# Patient Record
Sex: Female | Born: 1940 | Race: White | Hispanic: No | Smoking: Current every day smoker
Health system: Southern US, Community
[De-identification: ages and names within clinical notes are randomized; demographics above are authoritative.]

## PROBLEM LIST (undated history)

## (undated) DIAGNOSIS — Z950 Presence of cardiac pacemaker: Secondary | ICD-10-CM

## (undated) DIAGNOSIS — F32A Depression, unspecified: Secondary | ICD-10-CM

## (undated) DIAGNOSIS — I639 Cerebral infarction, unspecified: Secondary | ICD-10-CM

## (undated) DIAGNOSIS — F329 Major depressive disorder, single episode, unspecified: Secondary | ICD-10-CM

## (undated) HISTORY — PX: PACEMAKER PLACEMENT: SHX43

---

## 2004-02-02 ENCOUNTER — Other Ambulatory Visit: Payer: Self-pay

## 2004-11-13 ENCOUNTER — Ambulatory Visit: Payer: Self-pay | Admitting: Internal Medicine

## 2005-01-14 ENCOUNTER — Ambulatory Visit: Payer: Self-pay | Admitting: Internal Medicine

## 2005-04-23 ENCOUNTER — Ambulatory Visit: Payer: Self-pay | Admitting: Gastroenterology

## 2005-05-21 ENCOUNTER — Ambulatory Visit: Payer: Self-pay | Admitting: Internal Medicine

## 2005-11-20 ENCOUNTER — Ambulatory Visit: Payer: Self-pay | Admitting: Internal Medicine

## 2005-12-01 ENCOUNTER — Ambulatory Visit: Payer: Self-pay | Admitting: Internal Medicine

## 2005-12-09 ENCOUNTER — Ambulatory Visit: Payer: Self-pay | Admitting: Internal Medicine

## 2006-01-27 ENCOUNTER — Ambulatory Visit: Payer: Self-pay | Admitting: Internal Medicine

## 2006-06-01 ENCOUNTER — Ambulatory Visit: Payer: Self-pay | Admitting: Internal Medicine

## 2006-12-03 ENCOUNTER — Ambulatory Visit: Payer: Self-pay | Admitting: Internal Medicine

## 2007-02-01 ENCOUNTER — Ambulatory Visit: Payer: Self-pay | Admitting: Internal Medicine

## 2007-06-08 ENCOUNTER — Ambulatory Visit: Payer: Self-pay | Admitting: Internal Medicine

## 2008-02-02 ENCOUNTER — Ambulatory Visit: Payer: Self-pay | Admitting: Internal Medicine

## 2009-02-07 ENCOUNTER — Ambulatory Visit: Payer: Self-pay | Admitting: Internal Medicine

## 2010-06-25 ENCOUNTER — Ambulatory Visit: Payer: Self-pay | Admitting: Internal Medicine

## 2011-02-04 ENCOUNTER — Ambulatory Visit: Payer: Self-pay | Admitting: Gastroenterology

## 2011-02-08 LAB — PATHOLOGY REPORT

## 2011-07-22 ENCOUNTER — Ambulatory Visit: Payer: Self-pay | Admitting: Internal Medicine

## 2012-08-02 ENCOUNTER — Ambulatory Visit: Payer: Self-pay | Admitting: Internal Medicine

## 2013-01-13 LAB — CBC
HGB: 12.1 g/dL (ref 12.0–16.0)
MCV: 86 fL (ref 80–100)
RDW: 14.7 % — ABNORMAL HIGH (ref 11.5–14.5)

## 2013-01-13 LAB — COMPREHENSIVE METABOLIC PANEL
Albumin: 3.2 g/dL — ABNORMAL LOW (ref 3.4–5.0)
Alkaline Phosphatase: 60 U/L (ref 50–136)
BUN: 10 mg/dL (ref 7–18)
Co2: 21 mmol/L (ref 21–32)
Creatinine: 0.81 mg/dL (ref 0.60–1.30)
EGFR (Non-African Amer.): >60
Glucose: 95 mg/dL (ref 65–99)
Osmolality: 284 (ref 275–301)
Potassium: 3.2 mmol/L — ABNORMAL LOW (ref 3.5–5.1)
SGOT(AST): 17 U/L (ref 15–37)
SGPT (ALT): 15 U/L (ref 12–78)
Sodium: 143 mmol/L (ref 136–145)
Total Protein: 6.2 g/dL — ABNORMAL LOW (ref 6.4–8.2)

## 2013-01-13 LAB — DRUG SCREEN, URINE
Barbiturates, Ur Screen: NEGATIVE (ref ?–200)
Cannabinoid 50 Ng, Ur ~~LOC~~: NEGATIVE (ref ?–50)
Cocaine Metabolite,Ur ~~LOC~~: NEGATIVE (ref ?–300)
MDMA (Ecstasy)Ur Screen: NEGATIVE (ref ?–500)
Methadone, Ur Screen: NEGATIVE (ref ?–300)
Opiate, Ur Screen: NEGATIVE (ref ?–300)
Phencyclidine (PCP) Ur S: NEGATIVE (ref ?–25)
Tricyclic, Ur Screen: NEGATIVE (ref ?–1000)

## 2013-01-13 LAB — URINALYSIS, COMPLETE
Bacteria: NONE SEEN
Bilirubin,UR: NEGATIVE
Ketone: NEGATIVE
Leukocyte Esterase: NEGATIVE
Nitrite: NEGATIVE
Ph: 6 (ref 4.5–8.0)
Protein: NEGATIVE
Squamous Epithelial: 1
WBC UR: 1 /HPF (ref 0–5)

## 2013-01-13 LAB — CK TOTAL AND CKMB (NOT AT ARMC): CK, Total: 34 U/L (ref 21–215)

## 2013-01-13 LAB — ETHANOL: Ethanol %: <0.003 % (ref 0.000–0.080)

## 2013-01-14 LAB — COMPREHENSIVE METABOLIC PANEL
Albumin: 2.6 g/dL — ABNORMAL LOW (ref 3.4–5.0)
Alkaline Phosphatase: 57 U/L (ref 50–136)
Anion Gap: 4 — ABNORMAL LOW (ref 7–16)
BUN: 8 mg/dL (ref 7–18)
Calcium, Total: 7.8 mg/dL — ABNORMAL LOW (ref 8.5–10.1)
Chloride: 118 mmol/L — ABNORMAL HIGH (ref 98–107)
Osmolality: 285 (ref 275–301)
Potassium: 3.9 mmol/L (ref 3.5–5.1)
SGOT(AST): 11 U/L — ABNORMAL LOW (ref 15–37)
Sodium: 144 mmol/L (ref 136–145)

## 2013-01-15 LAB — BASIC METABOLIC PANEL
Anion Gap: 6 — ABNORMAL LOW (ref 7–16)
Calcium, Total: 8.8 mg/dL (ref 8.5–10.1)
Co2: 23 mmol/L (ref 21–32)
Creatinine: 0.55 mg/dL — ABNORMAL LOW (ref 0.60–1.30)
EGFR (African American): >60
EGFR (Non-African Amer.): >60
Glucose: 87 mg/dL (ref 65–99)
Osmolality: 286 (ref 275–301)
Potassium: 4.4 mmol/L (ref 3.5–5.1)
Sodium: 145 mmol/L (ref 136–145)

## 2013-01-15 LAB — TSH: Thyroid Stimulating Horm: 2.38 u[IU]/mL

## 2013-01-17 ENCOUNTER — Inpatient Hospital Stay: Payer: Self-pay | Admitting: Internal Medicine

## 2013-01-17 DIAGNOSIS — Z950 Presence of cardiac pacemaker: Secondary | ICD-10-CM

## 2013-01-17 LAB — CBC WITH DIFFERENTIAL/PLATELET
Basophil %: 0.7 %
Eosinophil #: 0.3 10*3/uL (ref 0.0–0.7)
Eosinophil %: 3.7 %
HCT: 33.9 % — ABNORMAL LOW (ref 35.0–47.0)
HGB: 11.6 g/dL — ABNORMAL LOW (ref 12.0–16.0)
Lymphocyte %: 18.7 %
MCH: 29.5 pg (ref 26.0–34.0)
MCV: 86 fL (ref 80–100)
Monocyte #: 0.5 x10 3/mm (ref 0.2–0.9)
Monocyte %: 6.3 %
Neutrophil #: 5.9 10*3/uL (ref 1.4–6.5)
RDW: 14.6 % — ABNORMAL HIGH (ref 11.5–14.5)
WBC: 8.3 10*3/uL (ref 3.6–11.0)

## 2013-01-17 LAB — BASIC METABOLIC PANEL
Anion Gap: 6 — ABNORMAL LOW (ref 7–16)
Chloride: 114 mmol/L — ABNORMAL HIGH (ref 98–107)
Co2: 24 mmol/L (ref 21–32)
Creatinine: 0.58 mg/dL — ABNORMAL LOW (ref 0.60–1.30)
EGFR (African American): >60
Osmolality: 284 (ref 275–301)
Potassium: 4.1 mmol/L (ref 3.5–5.1)

## 2013-01-17 LAB — APTT: Activated PTT: 28.6 secs (ref 23.6–35.9)

## 2013-01-18 LAB — CBC WITH DIFFERENTIAL/PLATELET
HCT: 35.2 % (ref 35.0–47.0)
HGB: 12.2 g/dL (ref 12.0–16.0)
Lymphocyte %: 17.8 %
MCH: 30.1 pg (ref 26.0–34.0)
MCHC: 34.7 g/dL (ref 32.0–36.0)
Monocyte #: 0.5 x10 3/mm (ref 0.2–0.9)
Neutrophil %: 71.9 %
Platelet: 192 10*3/uL (ref 150–440)
RBC: 4.05 10*6/uL (ref 3.80–5.20)
RDW: 14 % (ref 11.5–14.5)

## 2013-01-18 LAB — BASIC METABOLIC PANEL
Chloride: 113 mmol/L — ABNORMAL HIGH (ref 98–107)
Creatinine: 0.66 mg/dL (ref 0.60–1.30)
EGFR (African American): >60
Osmolality: 280 (ref 275–301)
Sodium: 141 mmol/L (ref 136–145)

## 2013-01-19 LAB — CULTURE, BLOOD (SINGLE)

## 2014-04-02 ENCOUNTER — Emergency Department: Payer: Self-pay | Admitting: Internal Medicine

## 2014-04-02 LAB — TROPONIN I: Troponin-I: <0.02 ng/mL

## 2014-04-02 LAB — CBC
HCT: 41.6 % (ref 35.0–47.0)
HGB: 13.4 g/dL (ref 12.0–16.0)
MCH: 28.6 pg (ref 26.0–34.0)
MCHC: 32.2 g/dL (ref 32.0–36.0)
MCV: 89 fL (ref 80–100)
PLATELETS: 226 10*3/uL (ref 150–440)
RBC: 4.68 10*6/uL (ref 3.80–5.20)
RDW: 14.2 % (ref 11.5–14.5)
WBC: 12.6 10*3/uL — AB (ref 3.6–11.0)

## 2014-04-02 LAB — COMPREHENSIVE METABOLIC PANEL
ALK PHOS: 77 U/L
ALT: 24 U/L
ANION GAP: 7 (ref 7–16)
Albumin: 3.7 g/dL (ref 3.4–5.0)
BILIRUBIN TOTAL: 0.4 mg/dL (ref 0.2–1.0)
BUN: 7 mg/dL (ref 7–18)
CALCIUM: 9.2 mg/dL (ref 8.5–10.1)
CREATININE: 0.85 mg/dL (ref 0.60–1.30)
Chloride: 106 mmol/L (ref 98–107)
Co2: 25 mmol/L (ref 21–32)
EGFR (African American): >60
EGFR (Non-African Amer.): >60
Glucose: 103 mg/dL — ABNORMAL HIGH (ref 65–99)
Osmolality: 274 (ref 275–301)
POTASSIUM: 3.8 mmol/L (ref 3.5–5.1)
SGOT(AST): 24 U/L (ref 15–37)
SODIUM: 138 mmol/L (ref 136–145)
TOTAL PROTEIN: 7.6 g/dL (ref 6.4–8.2)

## 2014-04-02 LAB — LITHIUM LEVEL: Lithium: 0.21 mmol/L — ABNORMAL LOW

## 2014-12-29 NOTE — Op Note (Signed)
PATIENT NAME:  Donna SimmondsCLARY, Alaisha H MR#:  [id number] DATE OF BIRTH:  [birth year]  DATE OF PROCEDURE:  01/17/2013  PRIMARY CARE PHYSICIAN: Dr. Marcello FennelHande  PREPROCEDURE DIAGNOSIS: Sinus bradycardia, sick sinus syndrome.   PROCEDURE: Dual chamber pacemaker implantation.   POSTPROCEDURE DIAGNOSIS: Atrial pacing with ventricular sensing.   INDICATION: The patient is a 74 year old female with a several week history of new onset generalized weakness and fatigue. [facility] Room following a syncopal episode and was noted to be bradycardic with sinus bradycardia with heart rates in the low 40s. The patient was admitted to telemetry where she ruled out for myocardial infarction. She has remained in sinus bradycardia with rates as low as 27 beats per minute. The procedure, risks, benefits, and alternatives of permanent pacemaker implantation were explained to the patient and informed written consent was obtained.   DESCRIPTION OF PROCEDURE: She was brought to the Operating Room in a fasting state. The left pectoral region was prepped and draped in the usual sterile manner. Anesthesia was obtained with 1% Xylocaine locally. A 6 cm incision was performed over the left pectoral region. The pacemaker pocket was generated by electrocautery and blunt dissection. Access was obtained through a left subclavian vein by fine needle aspiration. The right ventricular and right atrial leads were positioned into the right ventricular apex and right atrial appendage under fluoroscopic guidance. After proper thresholds were obtained, the leads were sutured in place. The leads were connected to a dual chamber rate responsive pacemaker generator (Medtronic Adapta [id number]). The pacemaker pocket was irrigated with gentamicin solution. The new pacemaker generator was positioned into the pocket. The pocket was closed with 2-0 and 4-0 Vicryl, respectively. Steri-Strips and pressure dressing were applied.     ____________________________ Marcina MillardAlexander Bassel Gaskill, MD ap:aw D: 01/17/2013 14:55:55 ET T: 01/18/2013 08:27:08 ET JOB#: [id number]  cc: Marcina MillardAlexander Edlin Ford, MD, <Dictator> Marcina MillardALEXANDER Gerri Acre MD ELECTRONICALLY SIGNED 01/25/2013 8:59

## 2014-12-29 NOTE — Consult Note (Signed)
PATIENT NAME:  Donna SimmondsCLARY, Rhylei H MR#:  [id number] DATE OF BIRTH:  1941-03-31  DATE OF CONSULTATION:  01/15/2013  REFERRING PHYSICIAN:  Bethann PunchesMark Miller, MD  CONSULTING PHYSICIAN:  Lamar BlinksBruce J. Akiva Josey, MD  REASON FOR CONSULTATION:  Syncope with bradycardia and hypertension.   CHIEF COMPLAINT: "I passed out".   HISTORY OF PRESENT ILLNESS:  This is a 74 year old female with known borderline hypertension and hyperlipidemia not requiring medication management. The patient has had a new onset of weakness and fatigue over the last several weeks to a month and then had an episode of syncope while in her driveway. She was somewhat somnolent for quite some time and was seen in the Emergency Room for this issue but had no evidence of congestive heart failure or true angina. Troponin, CK-MB were within normal limits with no evidence of myocardial infarction. The patient had an EKG showing sinus bradycardia and now has telemetry showing significant sinus bradycardia, which appears to be symptomatic in nature.   REVIEW OF SYSTEMS:  Her remainder of review of systems negative for vision change, ringing in the ears, hearing loss, cough, congestion, heartburn, nausea, vomiting, diarrhea, bloody stools, stomach pain, extremity pain, leg weakness, cramping of the buttocks, known blood clots, headaches, nosebleeds, congestion, trouble swallowing, frequent urination, urination at night, muscle weakness, numbness, anxiety, depression, skin lesions, skin rashes.   PAST MEDICAL HISTORY: 1.  Borderline hypertension. 2.  Hyperlipidemia.   FAMILY HISTORY:  No family members with early onset of cardiovascular disease.   SOCIAL HISTORY:  She smokes 1 pack per day and denies alcohol use.   ALLERGIES:  As listed.   MEDICATIONS:  As listed.   PHYSICAL EXAMINATION:  VITAL SIGNS:  Blood pressure is 110/62 bilaterally, heart rate is 48 upright, and reclining and regular.  GENERAL:  She is a well appearing female in no acute  distress.  HEENT:  No icterus, thyromegaly, ulcers, hemorrhage, or xanthelasma.  CARDIOVASCULAR:  Regular rate and rhythm with normal S1 and S2 without murmur, gallop, or rub. PMI is normal size and placement. Carotid upstroke normal without bruit. Jugular venous pressure is normal.  LUNGS:  A few basilar crackles with normal respirations.  ABDOMEN:  Soft, nontender, without hepatosplenomegaly or masses. Abdominal aorta is normal size without bruit.  EXTREMITIES:  Shows 2+ bilateral pulses in dorsal, pedal, radial, and femoral arteries without lower extremity edema, cyanosis, clubbing, or ulcers.  NEUROLOGIC:  She is oriented to time, place, and person with normal mood and affect.  ASSESSMENT:  This is a 74 year old female with borderline hypertension, hyperlipidemia, bipolar disorder with acute onset of syncope and symptomatic bradycardia with no current evidence of congestive heart failure or anginal symptoms.   RECOMMENDATIONS:  1.  Continue telemetry following for extent of heart rate control and further assessment and treatment.  2.  Echocardiogram for left ventricular systolic dysfunction, valvular heart disease causing syncope and/or other issues.  3.  Consideration of the possibility of carotid Doppler although currently no bruits heard.  4.  Proceed to dual-chamber pacemaker placement for symptomatic bradycardia. The patient understands the risks and benefits of dual-chamber pacemaker placement including the possibility of death, stroke, heart attack, infection, bleeding, blood clot, hemopericardium and pneumothorax. The patient is at low risk for conscious sedation.   ____________________________ Lamar BlinksBruce J. Kallon Caylor, MD bjk:jm D: 01/15/2013 11:14:54 ET T: 01/15/2013 16:54:32 ET JOB#: [id number]  cc: Lamar BlinksBruce J. Billiejean Schimek, MD, <Dictator> Lamar BlinksBRUCE J Airelle Everding MD ELECTRONICALLY SIGNED 01/17/2013 13:47

## 2014-12-29 NOTE — Consult Note (Signed)
PATIENT NAME:  Donna Dodson, Donna Dodson MR#:  [id number] DATE OF BIRTH:  02-22-1941  PSYCHIATRY CONSULTATION REPORT  DATE OF EVALUATION:  01/15/2013  CONSULTING PHYSICIAN:  Audery Amel, MD  IDENTIFYING INFORMATION AND REASON FOR CONSULT: A 74 year old woman with a history of bipolar disorder, currently in the hospital because of altered mental state, which is improving. Consultation for evaluation of history of bipolar disorder and depression.   HISTORY OF PRESENT ILLNESS: Information obtained from the patient and the chart. The patient was brought into the hospital after being found unconscious in her car, with an elevated temperature. She is now recovering from that. As far as her psychiatric condition she tells me that she has recently been feeling more down, depressed and bad. She attributes this primarily to the recent death of her husband. Her husband died 2 months ago and she is obviously continuing to grieve. Her mood stays down much of the time. Intermittent crying spells. She is sleeping well. Appetite has not been very good. She has had no interest in any activities. Has had no energy and is not leaving the house very much. She denies any psychotic symptoms. Denies any suicidal ideation currently, although she says the thought crossed her mind a couple of times recently. She says she had been compliant with her medicine, but on closer examination admits that she may have skipped it for a few days, which is why her lithium level was so low when she came in. She has been continuing to follow up with outpatient treatment with Dr. Alycia Rossetti, who has been her long-time psychiatrist.   PAST PSYCHIATRIC HISTORY: Long history of mental health problems. She describes psychiatric admissions a couple of decades ago for severe depression. She has had ECT in the past and has had suicide attempts in the past. She says there have also been episodes of mania in the past, but she has not had manic symptoms in many years.  She has been maintained on lithium and Zoloft and p.r.n. Ativan by Dr. Alycia Rossetti. Has not had a hospitalization in years.   SUBSTANCE USE HISTORY: Denies any current active substance abuse.   SOCIAL HISTORY: Elderly woman whose husband died a couple of months ago. She is not working. She does have adult children, but they do not live close by. Her social life is very limited. She is not involved in a church, does not get out very much, and admits that she has avoided social activities since her husband died.   MEDICAL HISTORY: Overall seems to be in pretty good medical health, all things considered, for her age.   CURRENT MEDICATIONS: Lithium carbonate 150 mg 3 times a day, Zoloft 50 mg per day, oxybutynin extended-release 10 mg per day.   ALLERGIES: SULFA DRUGS.   REVIEW OF SYSTEMS: Depressed mood, low energy, lack of interest in normal activities. No psychotic symptoms. Feels sleepy a lot of the time.   MENTAL STATUS EXAMINATION: The patient was cooperative and pleasant during the interview. Made good eye contact. Psychomotor activity sluggish. Speech was slow, but easy to understand. Affect blunted, but not tearful. Mood stated as being bad. Thoughts were generally lucid, with no evidence of loosening of associations or delusions. Denies auditory or visual hallucinations. Denies any active suicidal intent or plan, and can name reasons for not wanting to kill herself. Has reasonable optimism about improvement. Intelligence normal. Judgment and insight maintained. Alert and oriented x 4.   LABS: Most notable for her lithium level being less than 0.2,  which is the equivalent of  detectable. Her kidney function seems to be good.   EKG: Sinus bradycardia; otherwise pretty unremarkable.   ASSESSMENT: A 74 year old woman with a history of bipolar disorder, currently with depression symptoms also combined with grief. A little hard to distinguish major depression from grief in this situation. She does have  good insight into what she needs to do and the importance of trying to get more active. She does not appear to be an acute danger to herself. It looks like she was medicine-noncompliant recently, possibly out of the same anergia that is part of her depression.   TREATMENT PLAN: Supportive and educational therapy was done with the patient. Reviewed her medicines. She has been put back on the lithium since being in the hospital and she is completely agreeable to that, and the Zoloft. We will a recheck lithium level tomorrow.  I reinstated the order for the p.r.n. Ativan if she needs it, 1 mg twice a day p.r.n. The patient is agreeing to go to her outpatient appointment with Dr. Alycia Rossettiyan this upcoming week. She is not acutely dangerous to herself, does not need psychiatric hospitalization. I will come by and check on her and check on a lithium level tomorrow. I would anticipate that we can discharge her, probably on her current medicines, and she he is hooked up with good outpatient treatment and is not psychotic or acutely dangerous.   DIAGNOSES, PRINCIPAL AND PRIMARY:   AXIS I: Bipolar disorder, depressed.   SECONDARY DIAGNOSES: AXIS I: No further.  AXIS II: No diagnosis.  AXIS III: No diagnosis.  AXIS IV: Severe, from recent death of spouse.  AXIS V: Functioning at time of evaluation: 40.     ____________________________ Audery AmelJohn T. Clapacs, MD jtc:dm D: 01/15/2013 19:24:00 ET T: 01/16/2013 12:44:06 ET JOB#: [id number]  cc: Audery AmelJohn T. Clapacs, MD, <Dictator> Audery AmelJOHN T CLAPACS MD ELECTRONICALLY SIGNED 01/16/2013 21:52

## 2014-12-29 NOTE — Discharge Summary (Signed)
PATIENT NAME:  Donna Dodson, Donna Dodson MR#:  [id number] DATE OF BIRTH:  December 18, 1940  DATE OF ADMISSION:  01/17/2013 DATE OF DISCHARGE:    DISCHARGE DIAGNOSES: 1.  Symptomatic bradycardia with weakness and syncope.  2.  Status post pacemaker.  3.  Bipolar depression.  4.  Chronic tobacco use.   CHIEF COMPLAINT:  Weakness, fever, found unresponsive in the car.   HISTORY OF PRESENT ILLNESS:  Donna Dodson is a 74 year old female who was found by EMS unresponsive in the car where she was unable to get out with a fever of 102.  PAST MEDICAL HISTORY: Significant for bipolar syndrome followed by Dr. Alycia Rossettiyan and also has a history of type 2 diabetes, hyperlipidemia, chronic tobacco use, hyperparathyroidism and tremors.   PAST SURGICAL HISTORY:  Significant for total abdominal hysterectomy, appendectomy,  colonic resection and tonsillectomy.   PHYSICAL EXAMINATION: VITAL SIGNS:  She is afebrile, pulse was 80, respirations 20, blood pressure 106/60, pulse ox 96% on room air.  GENERAL: She was alert and oriented. No rash or petechiae.  HEENT:  NCAT. NECK:  Supple.  LUNGS:  Clear.  HEART:  S1, S2.  ABDOMEN: Soft, nontender.  EXTREMITIES:  No edema.  NEUROLOGIC:  Nonfocal.   HOSPITAL COURSE:  The patient was [facility] and received IV hydration; however, she was noted to be bradycardic with heart rate going as low as 38. She was seen in consultation by Dr. Toni Amendlapacs, as well as Dr. Gwen PoundsKowalski and underwent a pacemaker placement by Dr. Darrold JunkerParaschos on 01/17/2013. Symptomatically, she gradually improved. She was also seen by physical therapy and she was started on Zoloft for depression that got worse in the last few weeks because of her husband's demise. The patient made gradual progress.Her lithium level was low at 0.3 and kidney function was normal.She refused to to a rehab facility and preferred to go home with Home Health.She was discharged in stable condition.    The patient was discharged on the  following medications: 1.  Lithium 150 mg twice a day. 2.  Lorazepam 1 mg every 6 hours as needed. 3.  Ditropan XL 10 mg once a day. 4.  Sertraline 50 mg once a day. 5.  Acetaminophen codeine 1 tablet every 4 hours as needed for pain. 6  Keflex 500 mg po tid x 7 days  She has been advised to follow up with Dr. Gwen PoundsKowalski, cardiologist, and also me, Dr. Marcello FennelHande, in 1 to 2 weeks' time.   Total time spent in discharge and co ordination of care: 40 minutes   ____________________________ Donna ReichmannVishwanath Berl Bonfanti, MD vh:ce D: 01/19/2013 12:35:33 ET T: 01/19/2013 12:53:22 ET JOB#: [id number]  cc: Donna ReichmannVishwanath Klay Sobotka, MD, <Dictator> Donna ReichmannVISHWANATH Donna Cabello MD ELECTRONICALLY SIGNED 01/26/2013 18:02

## 2014-12-29 NOTE — Consult Note (Signed)
Psychiatry: Lithium level came back this morning as 0.3. Although this is low I would not change the dosage at this point because she is only been back on the medicine and after probably being noncompliant. He should take a few more days to equilabrate. Continue current treatment for  Electronic Signatures: Swan Zayed, Jackquline DenmarkJohn T (MD)  (Signed on 11-May-14 22:15)  Authored  Last Updated: 11-May-14 22:15 by Audery Amellapacs, Cassey Hurrell T (MD)

## 2014-12-29 NOTE — Consult Note (Signed)
Brief Consult Note: Diagnosis: bipolar disorder, depressed.   Patient was seen by consultant.   Consult note dictated.   Orders entered.   Comments: Psychiatry: PAtient seen. Patient with history of bipolar disorder. REcent loss of her husband. Having depressed symptoms but not acutely suicidal or dangerous to self. Alert and oriented and understands illness.Followed by Dr Alycia Rossettiyan and has appt coming up this week. Supportive therapy done. No change to Li or zoloft. Add prn ativan she has had before. REcheck Li level in the am.  Electronic Signatures: Clapacs, Jackquline DenmarkJohn T (MD)  (Signed 10-May-14 14:21)  Authored: Brief Consult Note   Last Updated: 10-May-14 14:21 by Audery Amellapacs, John T (MD)

## 2014-12-29 NOTE — H&P (Signed)
PATIENT NAME:  Donna Dodson, POZNANSKI MR#:  [id number] DATE OF BIRTH:  Oct 15, 1940  DATE OF ADMISSION:  01/13/2013  HISTORY OF PRESENT ILLNESS:  The patient is a 74 year old white lady followed by Dr. Marcello Fennel who was found unresponsive in her car.  She was attended to by EMS who recorded her temperature at 102 on the scene.  She was brought to the Emergency Room where she was noted to be warm to the touch and confused.  She was also noted to be hypotensive.  Laboratory evaluation in the ER was unremarkable except for a borderline low potassium.  Chest x-ray was unremarkable.  The patient was given vancomycin prophylactically by the ER physician.  The patient when I saw her was awake, alert and oriented x 3, although she is still relatively hypotensive.   PAST MEDICAL HISTORY:  Was notable for:  Bipolar syndrome for which she had been followed by Dr. Alycia Rossetti prior to his retirement, type 2 diabetes, hyperlipidemia, chronic tobacco use, a history of hyperparathyroidism, and a history of senile tremor.   PAST SURGICAL HISTORY:  Included a previous total abdominal hysterectomy in 1975 for endometriosis, an appendectomy, a colonic resection in 2005 for an adenoma, tonsillectomy.   ALLERGIES:  The patient was noted to be allergic to SULFA.   ADMISSION MEDICATIONS:  Included lithium carbonate 150 mg 3 times daily, lorazepam 1 mg every 4 hours as needed, enteric-coated Naprosyn 500 mg twice daily as needed, Zoloft 100 mg 1/2 tablet daily, oxybutynin 24 hour extended release 10 mg daily and MiraLAX powder 17 grams daily.   FAMILY HISTORY:  Noncontributory.   SOCIAL HISTORY:  Was notable for the fact that she continues to smoke 1/2 pack to a pack a day.  She apparently is a social drinker.   REVIEW OF SYSTEMS:  A reliable review of systems could not be obtained from the patient despite the fact she was alert and oriented because she did not even remember getting into her car much less passing out there.    PHYSICAL  EXAMINATION: VITAL SIGNS:  98.6, pulse is 80, respirations 20, blood pressure 106/60 and pulse ox is 96% on room air.  GENERAL:  This is an elderly lady who is alert and oriented x 3.  She is not flushed or warm to the touch.  There are no rashes or petechiae.  There is no lymphadenopathy.  HEENT:  Examination of the head, ears, eyes, nose and throat is unremarkable except for arterial narrowing on funduscopic exam.  NECK:  Is supple.  The thyroid is not palpable.  There is no JVD.  There are no carotid bruits.  LUNGS:  The lung fields are clear to auscultation.  BREASTS:  Not examined.  HEART:  Reveals a regular rhythm without murmur or gallop.  S1, S2 were normal.  ABDOMEN:  Soft and nontender.  Liver and spleen are not enlarged.  Bowel sounds were normal.  PELVIC AND RECTAL:  Deferred.  EXTREMITIES:  Showed no edema or deformity.  NEUROLOGIC:  Was physiological.   PLAN:  The patient is being admitted to observation where she will continue to receive IV hydration.  She was started back on her regular medications.  She had been on propranolol for senile tremor which apparently had recently been discontinued.  Supposedly she had been placed on Sinemet, but it is unclear whether or not she had been taking it.  It was not renewed.   HOSPITAL COURSE:  The patient will be re-evaluated in the morning.  If normotensive and still alert and oriented x 3 she can probably be discharged.     ____________________________ Letta PateJohn B. Danne HarborWalker III, MD jbw:ea D: 01/13/2013 20:28:12 ET T: 01/13/2013 20:56:43 ET JOB#: [id number]  cc: Jonny RuizJohn B. Danne HarborWalker III, MD, <Dictator> Elmo PuttJOHN B WALKER III MD ELECTRONICALLY SIGNED 01/14/2013 7:22

## 2015-07-26 ENCOUNTER — Emergency Department: Payer: Medicare Other

## 2015-07-26 ENCOUNTER — Emergency Department
Admission: EM | Admit: 2015-07-26 | Discharge: 2015-07-26 | Disposition: A | Discharge status: Home / Self Care | Payer: Medicare Other | Attending: Emergency Medicine | Admitting: Emergency Medicine

## 2015-07-26 ENCOUNTER — Encounter: Payer: Self-pay | Admitting: *Deleted

## 2015-07-26 DIAGNOSIS — R112 Nausea with vomiting, unspecified: Secondary | ICD-10-CM | POA: Diagnosis not present

## 2015-07-26 DIAGNOSIS — F17200 Nicotine dependence, unspecified, uncomplicated: Secondary | ICD-10-CM | POA: Diagnosis not present

## 2015-07-26 DIAGNOSIS — R197 Diarrhea, unspecified: Secondary | ICD-10-CM | POA: Insufficient documentation

## 2015-07-26 DIAGNOSIS — M79602 Pain in left arm: Secondary | ICD-10-CM | POA: Diagnosis not present

## 2015-07-26 HISTORY — DX: Major depressive disorder, single episode, unspecified: F32.9

## 2015-07-26 HISTORY — DX: Presence of cardiac pacemaker: Z95.0

## 2015-07-26 HISTORY — DX: Depression, unspecified: F32.A

## 2015-07-26 LAB — HEPATIC FUNCTION PANEL
ALT: 12 U/L — AB (ref 14–54)
AST: 22 U/L (ref 15–41)
Albumin: 4.2 g/dL (ref 3.5–5.0)
Alkaline Phosphatase: 75 U/L (ref 38–126)
Bilirubin, Direct: <0.1 mg/dL — ABNORMAL LOW (ref 0.1–0.5)
TOTAL PROTEIN: 7.7 g/dL (ref 6.5–8.1)
Total Bilirubin: 0.5 mg/dL (ref 0.3–1.2)

## 2015-07-26 LAB — BASIC METABOLIC PANEL
ANION GAP: 9 (ref 5–15)
BUN: 10 mg/dL (ref 6–20)
CALCIUM: 9.8 mg/dL (ref 8.9–10.3)
CO2: 23 mmol/L (ref 22–32)
Chloride: 107 mmol/L (ref 101–111)
Creatinine, Ser: 0.71 mg/dL (ref 0.44–1.00)
GLUCOSE: 110 mg/dL — AB (ref 65–99)
Potassium: 3.8 mmol/L (ref 3.5–5.1)
SODIUM: 139 mmol/L (ref 135–145)

## 2015-07-26 LAB — TROPONIN I

## 2015-07-26 LAB — CBC
HCT: 43.2 % (ref 35.0–47.0)
HEMOGLOBIN: 14.2 g/dL (ref 12.0–16.0)
MCH: 28.5 pg (ref 26.0–34.0)
MCHC: 33 g/dL (ref 32.0–36.0)
MCV: 86.3 fL (ref 80.0–100.0)
Platelets: 281 10*3/uL (ref 150–440)
RBC: 5 MIL/uL (ref 3.80–5.20)
RDW: 14.9 % — AB (ref 11.5–14.5)
WBC: 10.7 10*3/uL (ref 3.6–11.0)

## 2015-07-26 LAB — LIPASE, BLOOD: LIPASE: 26 U/L (ref 11–51)

## 2015-07-26 MED ORDER — ONDANSETRON 4 MG PO TBDP: 4.0000 mg | ORAL_TABLET | Freq: Three times a day (TID) | ORAL | Status: DC | PRN

## 2015-07-26 MED ORDER — ONDANSETRON HCL 4 MG/2ML IJ SOLN
4.0000 mg | Freq: Once | INTRAMUSCULAR | Status: AC
  Administered 2015-07-26: 4 mg via INTRAVENOUS
  Filled 2015-07-26: qty 2

## 2015-07-26 MED ORDER — ACETAMINOPHEN 500 MG PO TABS
1000.0000 mg | ORAL_TABLET | Freq: Once | ORAL | Status: AC
  Administered 2015-07-26: 1000 mg via ORAL
  Filled 2015-07-26: qty 2

## 2015-07-26 NOTE — ED Provider Notes (Signed)
[facility] Emergency Department Provider Note  ____________________________________________  Time seen: Approximately 5:39 PM  I have reviewed the triage vital signs and the nursing notes.   HISTORY  Chief Complaint Arm Pain    HPI Donna Dodson is a 74 y.o. female with a history of pacemaker placement for symptomatic bradycardia and 2014 presenting with left arm pain, nausea vomiting and diarrhea. Patient states that yesterday she developed a pain in her entire left arm that felt "like a toothache." She also had several episodes of nonbloody vomiting and 3 episodes of nonbloody loose stool. She did not have any chest pain, shortness of breath, palpitations, abdominal pain, fever, or urinary symptoms. Today, her arm pain persists, but she has not had any further episodes of nausea vomiting or diarrhea. She has a decreased appetite.   Past Medical History  Diagnosis Date  . Pacemaker   . Depressed     There are no active problems to display for this patient.   History reviewed. No pertinent past surgical history.  Current Outpatient Rx  Name  Route  Sig  Dispense  Refill  . ondansetron (ZOFRAN ODT) 4 MG disintegrating tablet   Oral   Take 1 tablet (4 mg total) by mouth every 8 (eight) hours as needed for nausea or vomiting.   20 tablet   0     Allergies Sulfa antibiotics  No family history on file.  Social History Social History  Substance Use Topics  . Smoking status: Current Every Day Smoker  . Smokeless tobacco: None  . Alcohol Use: No    Review of Systems Constitutional: No fever/chills. No lightheadedness or syncope. Eyes: No visual changes. ENT: No sore throat. Cardiovascular: Denies chest pain, palpitations. Respiratory: Denies shortness of breath.  No cough. Gastrointestinal: No abdominal pain.  Positive nausea, positive vomiting.  Positive diarrhea.  No constipation. Genitourinary: Negative for dysuria. Musculoskeletal:  Negative for back pain. Skin: Negative for rash. Neurological: Negative for headaches, focal weakness or numbness. No tingling. No changes in gait. No changes in vision or speech. No changes in mental status.  10-point ROS otherwise negative.  ____________________________________________   PHYSICAL EXAM:  VITAL SIGNS: ED Triage Vitals  Enc Vitals Group     BP 07/26/15 1714 127/79 mmHg     Pulse Rate 07/26/15 1714 74     Resp 07/26/15 1714 20     Temp 07/26/15 1714 98.7 F (37.1 C)     Temp Source 07/26/15 1714 Oral     SpO2 07/26/15 1714 96 %     Weight 07/26/15 1714 172 lb (78.019 kg)     Height 07/26/15 1714 5\' 5"  (1.651 m)     Head Cir --      Peak Flow --      Pain Score 07/26/15 1715 5     Pain Loc --      Pain Edu? --      Excl. in GC? --     Constitutional: Alert and oriented. Well appearing and in no acute distress. Answers questions appropriately. Eyes: Conjunctivae are normal.  EOMI. PERRLA. Head: Atraumatic. Nose: No congestion/rhinnorhea. Mouth/Throat: Mucous membranes are moist.  Neck: No stridor.  Supple.  No meningismus. No JVD. Cardiovascular: Normal rate, regular rhythm. No murmurs, rubs or gallops.  Respiratory: Normal respiratory effort.  No retractions. Lungs CTAB.  No wheezes, rales or ronchi. Gastrointestinal: Abdomen is soft, nontender, nondistended. She has no guarding or rebound. No perineal signs. Musculoskeletal: No LE edema. No calf  pain or palpable cords. No Homans sign. Full range of motion of the left shoulder, elbow, and wrist without pain. Normal left radial pulse. No evidence of swelling in the left upper externa joints. Neurologic: Alert and oriented 3. Speech is clear.  Face and smile symmetric. Tongue is midline.  No pronator drift. 5 out of 5 grip, biceps, triceps, hip flexors, plantar flexion and dorsiflexion. Normal sensation to light touch in the bilateral upper and lower extremities, and face. Normal gait without ataxia. Skin:  Skin  is warm, dry and intact. No rash noted. Psychiatric: Mood and affect are normal. Speech and behavior are normal.  Normal judgement.  ____________________________________________   LABS (all labs ordered are listed, but only abnormal results are displayed)  Labs Reviewed  BASIC METABOLIC PANEL - Abnormal; Notable for the following:    Glucose, Bld 110 (*)    All other components within normal limits  CBC - Abnormal; Notable for the following:    RDW 14.9 (*)    All other components within normal limits  HEPATIC FUNCTION PANEL - Abnormal; Notable for the following:    ALT 12 (*)    Bilirubin, Direct <0.1 (*)    All other components within normal limits  TROPONIN I  LIPASE, BLOOD  URINALYSIS COMPLETEWITH MICROSCOPIC ([facility] ONLY)   ____________________________________________  EKG  ED ECG REPORT I, Rockne Menghini, the attending physician, personally viewed and interpreted this ECG.   Date: 07/26/2015  EKG Time: 1709  Rate: 69  Rhythm: normal sinus rhythm  Axis: Normal  Intervals:none  ST&T Change: Nonspecific T-wave inversions in V1 and V2. No ST elevations.  Patient previously paced in EKGs from 01/2013 with similar T-wave inversions in V1 and V2. ____________________________________________  RADIOLOGY  Dg Chest 2 View  07/26/2015  CLINICAL DATA:  Left arm pain and nausea and vomiting EXAM: CHEST - 2 VIEW COMPARISON:  01/17/2013 common 06/08/2007 FINDINGS: Cardiac shadow is stable. A pacing device is again seen. The lungs are well aerated bilaterally. No focal infiltrate or sizable effusion is seen. A small nodule is noted between the anterior aspects of the third and fourth ribs on the right. It measures approximately 6 mm in dimension. IMPRESSION: No acute abnormality is noted. A nodular density is on the right as described. When compared with the prior CT examination from 2008 this appears to have been present at that time. Nonemergent noncontrast CT of the chest  may be helpful to confirm that this nodule corresponds to that seen on the prior exam. Electronically Signed   By: Alcide Clever M.D.   On: 07/26/2015 18:23    ____________________________________________   PROCEDURES  Procedure(s) performed: None  Critical Care performed: No ____________________________________________   INITIAL IMPRESSION / ASSESSMENT AND PLAN / ED COURSE  Pertinent labs & imaging results that were available during my care of the patient were reviewed by me and considered in my medical decision making (see chart for details).  74 y.o. female with a history of symptom at a bradycardia status post pacemaker presenting with left arm pain. She had several episodes of nausea vomiting and diarrhea which have now resolved. The patient's vital signs are reassuring and her exam does not show any signs of acute neurologic changes, and she has a reassuring cardiopulmonary exam. Given her age and history of pacemaker, I will evaluate her for an arrhythmia, consider acute MI, ACS. I do not see any evidence of neurologic deficits of stroke is unlikely. I will also evaluate for any electrolyte abnormalities.  It is unlikely that her arm pain is related to a traumatic injury as she has had no trauma.  Patient's evaluation in the emergency department did not show any evidence of acute MI, ACS, or ischemia. She has no evidence of CVA today. The patient is feeling better and will be discharged with close PMD follow-up. She understands return precautions as well as follow-up instructions.  ____________________________________________  FINAL CLINICAL IMPRESSION(S) / ED DIAGNOSES  Final diagnoses:  Left arm pain  Nausea vomiting and diarrhea      NEW MEDICATIONS STARTED DURING THIS VISIT:  Discharge Medication List as of 07/26/2015  7:01 PM    START taking these medications   Details  ondansetron (ZOFRAN ODT) 4 MG disintegrating tablet Take 1 tablet (4 mg total) by mouth every 8  (eight) hours as needed for nausea or vomiting., Starting 07/26/2015, Until Discontinued, Print         Rockne Menghini, MD 07/26/15 2233

## 2015-07-26 NOTE — ED Notes (Signed)
Yesterday develped left arm pain, n/v/d

## 2015-07-26 NOTE — Discharge Instructions (Signed)
Please drink plenty of fluid to stay well hydrated. Please take Zofran as needed for nausea. Please take a BRAT diet for the next 24-48 hours, then advance her diet as tolerated.  For your arm pain, you may take Tylenol or Motrin.  Please return to the emergency department if he developed chest pain, palpitations, shortness of breath, fainting, numbness tingling or weakness, or any other symptoms concerning to you.

## 2016-03-19 ENCOUNTER — Emergency Department: Payer: Medicare Other

## 2016-03-19 ENCOUNTER — Inpatient Hospital Stay
Admission: EM | Admit: 2016-03-19 | Discharge: 2016-03-23 | DRG: 558 | Disposition: A | Discharge status: Skilled Nursing Facility | Payer: Medicare Other | Attending: Internal Medicine | Admitting: Internal Medicine

## 2016-03-19 ENCOUNTER — Encounter: Payer: Self-pay | Admitting: Emergency Medicine

## 2016-03-19 DIAGNOSIS — E860 Dehydration: Secondary | ICD-10-CM | POA: Diagnosis present

## 2016-03-19 DIAGNOSIS — Z950 Presence of cardiac pacemaker: Secondary | ICD-10-CM

## 2016-03-19 DIAGNOSIS — Z79899 Other long term (current) drug therapy: Secondary | ICD-10-CM

## 2016-03-19 DIAGNOSIS — Y92002 Bathroom of unspecified non-institutional (private) residence single-family (private) house as the place of occurrence of the external cause: Secondary | ICD-10-CM

## 2016-03-19 DIAGNOSIS — R4182 Altered mental status, unspecified: Secondary | ICD-10-CM | POA: Diagnosis present

## 2016-03-19 DIAGNOSIS — R531 Weakness: Secondary | ICD-10-CM

## 2016-03-19 DIAGNOSIS — M6282 Rhabdomyolysis: Principal | ICD-10-CM | POA: Diagnosis present

## 2016-03-19 DIAGNOSIS — F439 Reaction to severe stress, unspecified: Secondary | ICD-10-CM | POA: Diagnosis present

## 2016-03-19 DIAGNOSIS — W19XXXA Unspecified fall, initial encounter: Secondary | ICD-10-CM | POA: Diagnosis present

## 2016-03-19 DIAGNOSIS — R55 Syncope and collapse: Secondary | ICD-10-CM | POA: Diagnosis not present

## 2016-03-19 DIAGNOSIS — Z23 Encounter for immunization: Secondary | ICD-10-CM

## 2016-03-19 DIAGNOSIS — Z882 Allergy status to sulfonamides status: Secondary | ICD-10-CM

## 2016-03-19 DIAGNOSIS — D72829 Elevated white blood cell count, unspecified: Secondary | ICD-10-CM | POA: Diagnosis present

## 2016-03-19 DIAGNOSIS — E876 Hypokalemia: Secondary | ICD-10-CM | POA: Diagnosis present

## 2016-03-19 DIAGNOSIS — F17210 Nicotine dependence, cigarettes, uncomplicated: Secondary | ICD-10-CM | POA: Diagnosis present

## 2016-03-19 DIAGNOSIS — F329 Major depressive disorder, single episode, unspecified: Secondary | ICD-10-CM | POA: Diagnosis not present

## 2016-03-19 DIAGNOSIS — I639 Cerebral infarction, unspecified: Secondary | ICD-10-CM

## 2016-03-19 LAB — CBC WITH DIFFERENTIAL/PLATELET
Basophils Absolute: 0.1 10*3/uL (ref 0–0.1)
Basophils Relative: 1 %
Eosinophils Absolute: 0 10*3/uL (ref 0–0.7)
Eosinophils Relative: 0 %
HEMATOCRIT: 38.4 % (ref 35.0–47.0)
HEMOGLOBIN: 12.9 g/dL (ref 12.0–16.0)
LYMPHS ABS: 0.8 10*3/uL — AB (ref 1.0–3.6)
LYMPHS PCT: 5 %
MCH: 29 pg (ref 26.0–34.0)
MCHC: 33.7 g/dL (ref 32.0–36.0)
MCV: 85.9 fL (ref 80.0–100.0)
Monocytes Absolute: 0.8 10*3/uL (ref 0.2–0.9)
Monocytes Relative: 5 %
NEUTROS ABS: 13.1 10*3/uL — AB (ref 1.4–6.5)
NEUTROS PCT: 89 %
Platelets: 181 10*3/uL (ref 150–440)
RBC: 4.47 MIL/uL (ref 3.80–5.20)
RDW: 14.6 % — ABNORMAL HIGH (ref 11.5–14.5)
WBC: 14.8 10*3/uL — AB (ref 3.6–11.0)

## 2016-03-19 LAB — COMPREHENSIVE METABOLIC PANEL
ALK PHOS: 71 U/L (ref 38–126)
ALT: 39 U/L (ref 14–54)
ANION GAP: 7 (ref 5–15)
AST: 118 U/L — ABNORMAL HIGH (ref 15–41)
Albumin: 3.2 g/dL — ABNORMAL LOW (ref 3.5–5.0)
BUN: 26 mg/dL — ABNORMAL HIGH (ref 6–20)
CALCIUM: 8.3 mg/dL — AB (ref 8.9–10.3)
CO2: 23 mmol/L (ref 22–32)
Chloride: 108 mmol/L (ref 101–111)
Creatinine, Ser: 0.66 mg/dL (ref 0.44–1.00)
GLUCOSE: 101 mg/dL — AB (ref 65–99)
Potassium: 3.4 mmol/L — ABNORMAL LOW (ref 3.5–5.1)
Sodium: 138 mmol/L (ref 135–145)
TOTAL PROTEIN: 6.5 g/dL (ref 6.5–8.1)
Total Bilirubin: 1.2 mg/dL (ref 0.3–1.2)

## 2016-03-19 LAB — MAGNESIUM: Magnesium: 2 mg/dL (ref 1.7–2.4)

## 2016-03-19 LAB — CK: Total CK: 9369 U/L — ABNORMAL HIGH (ref 38–234)

## 2016-03-19 LAB — TROPONIN I: Troponin I: <0.03 ng/mL (ref <0.03)

## 2016-03-19 MED ORDER — POTASSIUM CHLORIDE CRYS ER 20 MEQ PO TBCR
40.0000 meq | EXTENDED_RELEASE_TABLET | Freq: Once | ORAL | Status: AC
  Administered 2016-03-19: 22:00:00 40 meq via ORAL
  Filled 2016-03-19: qty 2

## 2016-03-19 MED ORDER — SODIUM CHLORIDE 0.9 % IV BOLUS (SEPSIS): 500.0000 mL | Freq: Once | INTRAVENOUS | Status: DC

## 2016-03-19 MED ORDER — DOCUSATE SODIUM 100 MG PO CAPS
100.0000 mg | ORAL_CAPSULE | Freq: Two times a day (BID) | ORAL | Status: DC
  Administered 2016-03-19 – 2016-03-22 (×5): 100 mg via ORAL
  Filled 2016-03-19 (×7): qty 1

## 2016-03-19 MED ORDER — SODIUM CHLORIDE 0.9 % IV SOLN
Freq: Once | INTRAVENOUS | Status: AC
  Administered 2016-03-19: 17:00:00 via INTRAVENOUS

## 2016-03-19 MED ORDER — ONDANSETRON HCL 4 MG/2ML IJ SOLN: 4.0000 mg | Freq: Four times a day (QID) | INTRAMUSCULAR | Status: DC | PRN

## 2016-03-19 MED ORDER — ALBUTEROL SULFATE (2.5 MG/3ML) 0.083% IN NEBU
2.5000 mg | INHALATION_SOLUTION | RESPIRATORY_TRACT | Status: DC | PRN
Start: 2016-03-19 — End: 2016-03-23

## 2016-03-19 MED ORDER — OXYBUTYNIN CHLORIDE ER 5 MG PO TB24
10.0000 mg | ORAL_TABLET | Freq: Every day | ORAL | Status: DC
Start: 2016-03-19 — End: 2016-03-23
  Administered 2016-03-19 – 2016-03-23 (×5): 10 mg via ORAL
  Filled 2016-03-19 (×5): qty 2

## 2016-03-19 MED ORDER — ENOXAPARIN SODIUM 40 MG/0.4ML ~~LOC~~ SOLN
40.0000 mg | SUBCUTANEOUS | Status: DC
  Administered 2016-03-19 – 2016-03-22 (×4): 40 mg via SUBCUTANEOUS
  Filled 2016-03-19 (×4): qty 0.4

## 2016-03-19 MED ORDER — ONDANSETRON HCL 4 MG PO TABS: 4.0000 mg | ORAL_TABLET | Freq: Four times a day (QID) | ORAL | Status: DC | PRN

## 2016-03-19 MED ORDER — ACETAMINOPHEN 325 MG PO TABS: 650.0000 mg | ORAL_TABLET | Freq: Four times a day (QID) | ORAL | Status: DC | PRN

## 2016-03-19 MED ORDER — ACETAMINOPHEN 650 MG RE SUPP: 650.0000 mg | Freq: Four times a day (QID) | RECTAL | Status: DC | PRN

## 2016-03-19 MED ORDER — SENNOSIDES-DOCUSATE SODIUM 8.6-50 MG PO TABS
1.0000 | ORAL_TABLET | Freq: Every evening | ORAL | Status: DC | PRN
  Administered 2016-03-22: 1 via ORAL
  Filled 2016-03-19: qty 1

## 2016-03-19 MED ORDER — SODIUM CHLORIDE 0.9 % IV SOLN
1000.0000 mL | Freq: Once | INTRAVENOUS | Status: AC
  Administered 2016-03-19: 1000 mL via INTRAVENOUS

## 2016-03-19 MED ORDER — HYDROCODONE-ACETAMINOPHEN 5-325 MG PO TABS: 1.0000 | ORAL_TABLET | ORAL | Status: DC | PRN

## 2016-03-19 MED ORDER — SODIUM CHLORIDE 0.9 % IV SOLN
INTRAVENOUS | Status: DC
  Administered 2016-03-19 – 2016-03-23 (×8): via INTRAVENOUS

## 2016-03-19 NOTE — ED Notes (Signed)
Patient at CT

## 2016-03-19 NOTE — ED Notes (Signed)
Could not transfer patient due to closeness of shift change.  Explained this to patient and she was brought back to room ED09 and made comfortable.  Will try again around 1930.

## 2016-03-19 NOTE — ED Notes (Signed)
XR at bedside

## 2016-03-19 NOTE — ED Notes (Signed)
Per EMS patient was found on the floor in the bathroom and with altered mental status.  EMS thinks she has been in the bathroom on the floor for two days.  She was found by a neighbor that apparently heard her cries.  She was found in feces and urine on the floor.

## 2016-03-19 NOTE — ED Provider Notes (Signed)
[facility] Emergency Department Provider Note        Time seen: ----------------------------------------- 2:23 PM on 03/19/2016 -----------------------------------------  L5 caveat: Review of systems and history is limited by altered mental status  I have reviewed the triage vital signs and the nursing notes.   HISTORY  Chief Complaint Fall and Altered Mental Status    HPI Donna Dodson is a 75 y.o. female who presents the ER being found on the floor of her home in the bathroom. It is suspected that she is been there for 2 days. EMS states she has been altered and confused. EMS had to force their way into the home. Patient denies complaints at this time.   Past Medical History  Diagnosis Date  . Pacemaker   . Depressed     There are no active problems to display for this patient.   No past surgical history on file.  Allergies Sulfa antibiotics  Social History Social History  Substance Use Topics  . Smoking status: Current Every Day Smoker  . Smokeless tobacco: Not on file  . Alcohol Use: No    Review of Systems Unknown at this time  ____________________________________________   PHYSICAL EXAM:  VITAL SIGNS: ED Triage Vitals  Enc Vitals Group     BP --      Pulse --      Resp --      Temp --      Temp src --      SpO2 --      Weight --      Height --      Head Cir --      Peak Flow --      Pain Score --      Pain Loc --      Pain Edu? --      Excl. in GC? --     Constitutional: Alert But disoriented, mild distress Eyes: Conjunctivae are normal. PERRL. Normal extraocular movements. ENT   Head: Normocephalic and atraumatic.   Nose: No congestion/rhinnorhea.   Mouth/Throat: Mucous membranes are dry   Neck: No stridor. Cardiovascular: Normal rate, regular rhythm. No murmurs, rubs, or gallops. Respiratory: Normal respiratory effort without tachypnea nor retractions. Breath sounds are clear and equal  bilaterally. No wheezes/rales/rhonchi. Gastrointestinal: Soft and nontender. Normal bowel sounds Musculoskeletal: Limited but unremarkable range of motion of the lower extremities. Some lower extremity edema is noted Neurologic:  Normal speech and language. Generalized weakness, nothing focal. Mild confusion. Skin:  Skin is warm, dry and intact. No rash noted. Psychiatric: Mood and affect are normal. Speech and behavior are normal.  ____________________________________________  ED COURSE:  Pertinent labs & imaging results that were available during my care of the patient were reviewed by me and considered in my medical decision making (see chart for details). Patient presents the ER after having been found on the floor of her home 2 days. She will likely be in rhabdomyolysis. We have started IV fluids and will check basic labs. ____________________________________________    LABS (pertinent positives/negatives)  Labs Reviewed  CBC WITH DIFFERENTIAL/PLATELET - Abnormal; Notable for the following:    WBC 14.8 (*)    RDW 14.6 (*)    Neutro Abs 13.1 (*)    Lymphs Abs 0.8 (*)    All other components within normal limits  COMPREHENSIVE METABOLIC PANEL - Abnormal; Notable for the following:    Potassium 3.4 (*)    Glucose, Bld 101 (*)    BUN 26 (*)  Calcium 8.3 (*)    Albumin 3.2 (*)    AST 118 (*)    All other components within normal limits  URINALYSIS COMPLETEWITH MICROSCOPIC ([facility] ONLY) - Abnormal; Notable for the following:    Color, Urine YELLOW (*)    APPearance CLEAR (*)    Ketones, ur 2+ (*)    Bacteria, UA RARE (*)    Squamous Epithelial / LPF 0-5 (*)    All other components within normal limits  CK - Abnormal; Notable for the following:    Total CK 9369 (*)    All other components within normal limits  TROPONIN I  MAGNESIUM  BASIC METABOLIC PANEL  CBC  CK    RADIOLOGY  CT head, chest x-ray, pelvis x-ray Pending at this  time ____________________________________________  FINAL ASSESSMENT AND PLAN  Fall, weakness, dehydration, rhabdomyolysis  Plan: Patient with labs and imaging as dictated above. Patient care has been checked out to Dr.Veronese for final disposition. Patient will require admission due to suspected rhabdomyolysis. She is also to weak to stand.   Emily Filbert, MD   Note: This dictation was prepared with Dragon dictation. Any transcriptional errors that result from this process are unintentional   Emily Filbert, MD 03/20/16 [id number]

## 2016-03-19 NOTE — ED Notes (Signed)
Patient returned from CT

## 2016-03-19 NOTE — H&P (Signed)
[facility] [place] [facility]   PATIENT NAME: Donna PriestJudith Wollam    MR#:  [id number]  DATE OF BIRTH:  02/27/1941  DATE OF ADMISSION:  03/19/2016  PRIMARY CARE PHYSICIAN: Barbette ReichmannHANDE,VISHWANATH, MD   REQUESTING/REFERRING PHYSICIAN: Emily FilbertJonathan E Williams, MD  CHIEF COMPLAINT:   Chief Complaint  Patient presents with  . Fall  . Altered Mental Status   Fall and altered mental status today. HISTORY OF PRESENT ILLNESS:  Donna Dodson  is a 75 y.o. female with a known history of Pacemaker placement and depression. The patient was sent to the ED due to confusion and fall. She was found on the floor in the bathroom in her home. It is suspected that she has been there for 2 days. The patient is confused, unable to provide any information. But she denies any symptoms. She was found to have elevated CK and start IV fluid support.  PAST MEDICAL HISTORY:   Past Medical History  Diagnosis Date  . Pacemaker   . Depressed     PAST SURGICAL HISTORY:  History reviewed. No pertinent past surgical history.  SOCIAL HISTORY:   Social History  Substance Use Topics  . Smoking status: Current Every Day Smoker -- 0.50 packs/day  . Smokeless tobacco: Not on file  . Alcohol Use: No    FAMILY HISTORY:  No family history on file. The patient denies any family history.  DRUG ALLERGIES:   Allergies  Allergen Reactions  . Sulfa Antibiotics Rash    REVIEW OF SYSTEMS:  CONSTITUTIONAL: No fever, fatigue or weakness.  EYES: No blurred or double vision.  EARS, NOSE, AND THROAT: No tinnitus or ear pain.  RESPIRATORY: No cough, shortness of breath, wheezing or hemoptysis.  CARDIOVASCULAR: No chest pain, orthopnea, edema.  GASTROINTESTINAL: No nausea, vomiting, diarrhea or abdominal pain.  GENITOURINARY: No dysuria, hematuria.  ENDOCRINE: No polyuria, nocturia,  HEMATOLOGY: No anemia, easy bruising or bleeding SKIN: No rash or lesion. MUSCULOSKELETAL: No joint pain or arthritis.    NEUROLOGIC: No tingling, numbness, weakness.  PSYCHIATRY: No anxiety or depression.   MEDICATIONS AT HOME:   Prior to Admission medications   Medication Sig Start Date End Date Taking? Authorizing Provider  atorvastatin (LIPITOR) 20 MG tablet Take 1 tablet by mouth at bedtime. 03/28/14  Yes Historical Provider, MD  oxybutynin (DITROPAN-XL) 10 MG 24 hr tablet Take 1 tablet by mouth daily. 10/24/14  Yes Historical Provider, MD  Vitamin D, Ergocalciferol, (DRISDOL) 50000 units CAPS capsule Take 1 capsule by mouth every 7 (seven) days. 06/30/14  Yes Historical Provider, MD      VITAL SIGNS:  Blood pressure 124/53, pulse 63, temperature 98.6 F (37 C), temperature source Oral, resp. rate 24, weight 175 lb (79.379 kg), SpO2 97 %.  PHYSICAL EXAMINATION:  GENERAL:  75 y.o.-year-old patient lying in the bed with no acute distress. Morbid obesity. EYES: Pupils equal, round, reactive to light and accommodation. No scleral icterus. Extraocular muscles intact.  HEENT: Head atraumatic, normocephalic. Oropharynx and nasopharynx clear. Very dry oral mucosa. NECK:  Supple, no jugular venous distention. No thyroid enlargement, no tenderness.  LUNGS: Normal breath sounds bilaterally, no wheezing, rales,rhonchi or crepitation. No use of accessory muscles of respiration.  CARDIOVASCULAR: S1, S2 normal. No murmurs, rubs, or gallops.  ABDOMEN: Soft, nontender, nondistended. Bowel sounds present. No organomegaly or mass.  EXTREMITIES: No pedal edema, cyanosis, or clubbing.  NEUROLOGIC: Cranial nerves II through XII are intact. Muscle strength 2/5 in all extremities. Sensation intact. Gait not checked.  PSYCHIATRIC: The patient is confused, AAO x 2.  SKIN: No obvious rash, lesion, or ulcer.   LABORATORY PANEL:   CBC  Recent Labs Lab 03/19/16 1436  WBC 14.8*  HGB 12.9  HCT 38.4  PLT 181    ------------------------------------------------------------------------------------------------------------------  Chemistries   Recent Labs Lab 03/19/16 1436  NA 138  K 3.4*  CL 108  CO2 23  GLUCOSE 101*  BUN 26*  CREATININE 0.66  CALCIUM 8.3*  AST 118*  ALT 39  ALKPHOS 71  BILITOT 1.2   ------------------------------------------------------------------------------------------------------------------  Cardiac Enzymes  Recent Labs Lab 03/19/16 1436  TROPONINI <0.03   ------------------------------------------------------------------------------------------------------------------  RADIOLOGY:  Dg Chest 1 View  03/19/2016  CLINICAL DATA:  Pain following fall EXAM: CHEST 1 VIEW COMPARISON:  July 26, 2015 FINDINGS: There is no edema or consolidation. Heart is upper normal in size with pulmonary vascularity within normal limits. Pacemaker leads are attached to the right atrium and right ventricle. No adenopathy. No pneumothorax. No bone lesions. IMPRESSION: Pacemaker leads are attached to the right atrium and right ventricle. No edema or consolidation. Electronically Signed   By: Bretta Bang III M.D.   On: 03/19/2016 15:16   Dg Pelvis 1-2 Views  03/19/2016  CLINICAL DATA:  Fall.  Weakness and altered mental status EXAM: PELVIS - 1-2 VIEW COMPARISON:  None. FINDINGS: Both hips in normal alignment. No fracture in the hips or pelvis. Joint space narrowing and degenerative change in both hip joints. Surgical clips in the region of the rectum. IMPRESSION: Negative for fracture. Electronically Signed   By: Marlan Palau M.D.   On: 03/19/2016 15:14   Ct Head Wo Contrast  03/19/2016  CLINICAL DATA:  75 year old female with acute altered mental status, fall and weakness. EXAM: CT HEAD WITHOUT CONTRAST TECHNIQUE: Contiguous axial images were obtained from the base of the skull through the vertex without intravenous contrast. COMPARISON:  04/02/2014 and prior CTs FINDINGS:  Moderate to severe atrophy and chronic small-vessel white matter ischemic changes are again noted. No acute intracranial abnormalities are identified, including mass lesion or mass effect, hydrocephalus, extra-axial fluid collection, midline shift, hemorrhage, or acute infarction. The visualized bony calvarium is unremarkable. IMPRESSION: No evidence of acute intracranial abnormality. Moderate to severe cerebral atrophy and chronic small-vessel white matter ischemic changes. Electronically Signed   By: Harmon Pier M.D.   On: 03/19/2016 17:21    EKG:   Orders placed or performed during the hospital encounter of 03/19/16  . ED EKG  . ED EKG  . EKG 12-Lead  . EKG 12-Lead    IMPRESSION AND PLAN:   Rhabdomyolysis. Start normal saline I, follow-up CK and BMP.  Dehydration. Continue IV fluid to support and follow-up BMP. Hypokalemia. Give potassium supplement, check magnesium level and follow-up BMP. Leukocytosis. Possible due to reaction but need to rule out UTI. Follow-up urinalysis and CBC. Generalized weakness and fall. PT consult.  All the records are reviewed and case discussed with ED provider. Management plans discussed with the patient, family and they are in agreement.  CODE STATUS: Full code TOTAL TIME TAKING CARE OF THIS PATIENT: 56 minutes.    Shaune Pollack M.D on 03/19/2016 at 5:57 PM  Between 7am to 6pm - Pager - [phone]  After 6pm go to [email or web address] - password [email or web address]  [place] [facility]  Office  [phone]  CC: Primary care physician; Barbette Reichmann, MD

## 2016-03-19 NOTE — ED Provider Notes (Signed)
-----------------------------------------   4:51 PM on 03/19/2016 -----------------------------------------   Blood pressure 124/53, pulse 63, temperature 98.6 F (37 C), temperature source Oral, resp. rate 24, weight 175 lb (79.379 kg), SpO2 97 %.  Assuming care from Dr. Mayford KnifeWilliams.  In short, Donna Dodson is a 75 y.o. female with a chief complaint of Fall and Altered Mental Status  Patient found down for approximately 2 days. Altered and confused per EMS. Neuro intact in the ED. Labs and head CT pending.  ----------------------------------------- 5:27 PM on 03/19/2016 -----------------------------------------  CT scan negative. Patient with total CK close to 10,000, normal kidney function. Patient given 1L NS and started on IV hydration at 150cc/hr. Still not making urine. We'll admit for IV hydration.  Donna Sicklearolina Maja Mccaffery, MD 03/19/16 1729

## 2016-03-20 ENCOUNTER — Inpatient Hospital Stay: Payer: Medicare Other

## 2016-03-20 ENCOUNTER — Inpatient Hospital Stay: Admit: 2016-03-20 | Payer: Medicare Other

## 2016-03-20 ENCOUNTER — Inpatient Hospital Stay
Admit: 2016-03-20 | Discharge: 2016-03-20 | Disposition: A | Discharge status: Home / Self Care | Payer: Medicare Other | Attending: Internal Medicine | Admitting: Internal Medicine

## 2016-03-20 LAB — URINALYSIS COMPLETE WITH MICROSCOPIC (ARMC ONLY)
Bilirubin Urine: NEGATIVE
GLUCOSE, UA: NEGATIVE mg/dL
Hgb urine dipstick: NEGATIVE
Leukocytes, UA: NEGATIVE
Nitrite: NEGATIVE
PROTEIN: NEGATIVE mg/dL
Specific Gravity, Urine: 1.02 (ref 1.005–1.030)
pH: 5 (ref 5.0–8.0)

## 2016-03-20 LAB — CBC
HCT: 40.2 % (ref 35.0–47.0)
Hemoglobin: 13.4 g/dL (ref 12.0–16.0)
MCH: 29 pg (ref 26.0–34.0)
MCHC: 33.5 g/dL (ref 32.0–36.0)
MCV: 86.7 fL (ref 80.0–100.0)
PLATELETS: 164 10*3/uL (ref 150–440)
RBC: 4.63 MIL/uL (ref 3.80–5.20)
RDW: 14.9 % — AB (ref 11.5–14.5)
WBC: 10.6 10*3/uL (ref 3.6–11.0)

## 2016-03-20 LAB — ECHOCARDIOGRAM COMPLETE
Height: 65 in
WEIGHTICAEL: 2806 [oz_av]

## 2016-03-20 LAB — BASIC METABOLIC PANEL
Anion gap: 7 (ref 5–15)
BUN: 17 mg/dL (ref 6–20)
CALCIUM: 8.4 mg/dL — AB (ref 8.9–10.3)
CO2: 16 mmol/L — ABNORMAL LOW (ref 22–32)
Chloride: 112 mmol/L — ABNORMAL HIGH (ref 101–111)
Creatinine, Ser: 0.58 mg/dL (ref 0.44–1.00)
GFR calc Af Amer: >60 mL/min (ref >60)
GLUCOSE: 99 mg/dL (ref 65–99)
Potassium: 3.9 mmol/L (ref 3.5–5.1)
SODIUM: 135 mmol/L (ref 135–145)

## 2016-03-20 LAB — CK: CK TOTAL: 4426 U/L — AB (ref 38–234)

## 2016-03-20 MED ORDER — PNEUMOCOCCAL VAC POLYVALENT 25 MCG/0.5ML IJ INJ
0.5000 mL | INJECTION | INTRAMUSCULAR | Status: AC
  Administered 2016-03-21: 09:00:00 0.5 mL via INTRAMUSCULAR
  Filled 2016-03-20: qty 0.5

## 2016-03-20 NOTE — Progress Notes (Signed)
Lab notified that 0500am CBC, BMP, CK were not drawn. Lab tech states they will be up to draw, they did not see an order this morning.

## 2016-03-20 NOTE — Progress Notes (Signed)
Dr. Juliene PinaMody notified PT informed this RN that patient had increased right sided upper and lower extremity weakness when ambulating. Verbal order for MRI Brain w/o contrast. Will continue to closely monitor.

## 2016-03-20 NOTE — Progress Notes (Signed)
[facility] [place] [facility]   PATIENT NAME: Donna PriestJudith Dodson    MR#:  [id number]  DATE OF BIRTH:  07/28/1941  SUBJECTIVE:   Patient found in her house. She was on the floor for 2 days. She does not recall how she fell or if she fell. She does not recall passing out. She does not have a Lifeline.  REVIEW OF SYSTEMS:    Review of Systems  Constitutional: Negative for fever, chills and malaise/fatigue.  HENT: Negative for ear discharge, ear pain, hearing loss, nosebleeds and sore throat.   Eyes: Negative for blurred vision and pain.  Respiratory: Negative for cough, hemoptysis, shortness of breath and wheezing.   Cardiovascular: Negative for chest pain, palpitations and leg swelling.  Gastrointestinal: Negative for nausea, vomiting, abdominal pain, diarrhea and blood in stool.  Genitourinary: Negative for dysuria.  Musculoskeletal: Positive for falls. Negative for back pain.  Neurological: Positive for weakness. Negative for dizziness, tremors, speech change, focal weakness, seizures and headaches.  Endo/Heme/Allergies: Does not bruise/bleed easily.  Psychiatric/Behavioral: Negative for depression, suicidal ideas and hallucinations.    Tolerating Diet: yes      DRUG ALLERGIES:   Allergies  Allergen Reactions  . Sulfa Antibiotics Rash    VITALS:  Blood pressure 139/60, pulse 59, temperature 99 F (37.2 C), temperature source Oral, resp. rate 18, height 5\' 5"  (1.651 m), weight 79.55 kg (175 lb 6 oz), SpO2 100 %.  PHYSICAL EXAMINATION:   Physical Exam  Constitutional: She is oriented to person, place, and time and well-developed, well-nourished, and in no distress. No distress.  HENT:  Head: Normocephalic.  Eyes: No scleral icterus.  Neck: Normal range of motion. Neck supple. No JVD present. No tracheal deviation present.  Cardiovascular: Normal rate, regular rhythm and normal heart sounds.  Exam reveals no gallop and no friction rub.   No murmur  heard. Pulmonary/Chest: Effort normal and breath sounds normal. No respiratory distress. She has no wheezes. She has no rales. She exhibits no tenderness.  Abdominal: Soft. Bowel sounds are normal. She exhibits no distension and no mass. There is no tenderness. There is no rebound and no guarding.  Musculoskeletal: Normal range of motion. She exhibits no edema.  Neurological: She is alert and oriented to person, place, and time.  Skin: Skin is warm. No rash noted. No erythema.  Psychiatric: Affect and judgment normal.      LABORATORY PANEL:   CBC  Recent Labs Lab 03/19/16 1436  WBC 14.8*  HGB 12.9  HCT 38.4  PLT 181   ------------------------------------------------------------------------------------------------------------------  Chemistries   Recent Labs Lab 03/19/16 1436 03/19/16 2100  NA 138  --   K 3.4*  --   CL 108  --   CO2 23  --   GLUCOSE 101*  --   BUN 26*  --   CREATININE 0.66  --   CALCIUM 8.3*  --   MG  --  2.0  AST 118*  --   ALT 39  --   ALKPHOS 71  --   BILITOT 1.2  --    ------------------------------------------------------------------------------------------------------------------  Cardiac Enzymes  Recent Labs Lab 03/19/16 1436  TROPONINI <0.03   ------------------------------------------------------------------------------------------------------------------  RADIOLOGY:  Dg Chest 1 View  03/19/2016  CLINICAL DATA:  Pain following fall EXAM: CHEST 1 VIEW COMPARISON:  July 26, 2015 FINDINGS: There is no edema or consolidation. Heart is upper normal in size with pulmonary vascularity within normal limits. Pacemaker leads are attached to the right atrium and right  ventricle. No adenopathy. No pneumothorax. No bone lesions. IMPRESSION: Pacemaker leads are attached to the right atrium and right ventricle. No edema or consolidation. Electronically Signed   By: Bretta Bang III M.D.   On: 03/19/2016 15:16   Dg Pelvis 1-2  Views  03/19/2016  CLINICAL DATA:  Fall.  Weakness and altered mental status EXAM: PELVIS - 1-2 VIEW COMPARISON:  None. FINDINGS: Both hips in normal alignment. No fracture in the hips or pelvis. Joint space narrowing and degenerative change in both hip joints. Surgical clips in the region of the rectum. IMPRESSION: Negative for fracture. Electronically Signed   By: Marlan Palau M.D.   On: 03/19/2016 15:14   Ct Head Wo Contrast  03/19/2016  CLINICAL DATA:  75 year old female with acute altered mental status, fall and weakness. EXAM: CT HEAD WITHOUT CONTRAST TECHNIQUE: Contiguous axial images were obtained from the base of the skull through the vertex without intravenous contrast. COMPARISON:  04/02/2014 and prior CTs FINDINGS: Moderate to severe atrophy and chronic small-vessel white matter ischemic changes are again noted. No acute intracranial abnormalities are identified, including mass lesion or mass effect, hydrocephalus, extra-axial fluid collection, midline shift, hemorrhage, or acute infarction. The visualized bony calvarium is unremarkable. IMPRESSION: No evidence of acute intracranial abnormality. Moderate to severe cerebral atrophy and chronic small-vessel white matter ischemic changes. Electronically Signed   By: Harmon Pier M.D.   On: 03/19/2016 17:21     ASSESSMENT AND PLAN:   75 y/o female with pacemakerWho was found lying on the floor for 2 days and to have rhabdomyolysis.  1. Rhabdomyolysis: This is due to patient lying on the ground for 2 days. Labs this morning are pending. Continue IV fluids.  2. Fall/?? Syncope: Unclear etiology of her being found on the ground. Echo and carotid Doppler ordered. Cardiology consultation to interrogate pacemaker.   3. Leukocytosis: This is likely stress reaction and dehydration. White blood cell count has improved.   PT consult for disposition.  Management plans discussed with the patient and she is in agreement.  CODE STATUS:  full  TOTAL TIME TAKING CARE OF THIS PATIENT: 30 minutes.     POSSIBLE D/C 1-2 day, DEPENDING ON CLINICAL CONDITION.   Amarachi Kotz M.D on 03/20/2016 at 11:02 AM  Between 7am to 6pm - Pager - [phone] After 6pm go to [email or web address] - password [employer]  [facility]  Office  [phone]  CC: Primary care physician; Barbette Reichmann, MD  Note: This dictation was prepared with Dragon dictation along with smaller phrase technology. Any transcriptional errors that result from this process are unintentional.

## 2016-03-20 NOTE — Consult Note (Signed)
[facility] Cardiology Consultation Note  Patient ID: Donna Dodson, MRN: [id number], DOB/AGE: 73/12/1940 75 y.o. Admit date: 03/19/2016   Date of Consult: 03/20/2016 Primary Physician: Barbette ReichmannHANDE,VISHWANATH, MD Primary Cardiologist: None  Chief Complaint:  Chief Complaint  Patient presents with  . Fall  . Altered Mental Status   Reason for Consult: possible syncope with history of pacemaker  HPI: 75 y.o. female with apparent previous symptomatic bradycardia having some weakness and fatigue several years prior and needing a pacemaker placement status post pacemaker placement but no apparent significant improvements in overall symptoms. The patient has not had any apparent essential hypertension makes hyperlipidemia requiring treatment. She is having some concerns of depression in the past and has had new onset of apparent syncope. She does not know whether she truly passed out. But she has had some recollection of being on the floor for the last 2 days unable to get up. It is unclear as to whether this was from true syncope and she couldn't get up later or she just fell and doesn't recall actually passing out. The patient EKG currently shows normal sinus rhythm without evidence of issues and there is no evidence of myocardial infarction or heart failure symptoms. The patient does have an elevated creatinine kinase consistent with being on the floor for 2 days  Past Medical History  Diagnosis Date  . Pacemaker   . Depressed       Surgical History: History reviewed. No pertinent past surgical history.   Home Meds: Prior to Admission medications   Medication Sig Start Date End Date Taking? Authorizing Provider  atorvastatin (LIPITOR) 20 MG tablet Take 1 tablet by mouth at bedtime. 03/28/14  Yes Historical Provider, MD  oxybutynin (DITROPAN-XL) 10 MG 24 hr tablet Take 1 tablet by mouth daily. 10/24/14  Yes Historical Provider, MD  Vitamin D, Ergocalciferol, (DRISDOL) 50000 units CAPS capsule  Take 1 capsule by mouth every 7 (seven) days. 06/30/14  Yes Historical Provider, MD    Inpatient Medications:  . docusate sodium  100 mg Oral BID  . enoxaparin (LOVENOX) injection  40 mg Subcutaneous Q24H  . oxybutynin  10 mg Oral Daily  . [START ON 03/21/2016] pneumococcal 23 valent vaccine  0.5 mL Intramuscular Tomorrow-1000   . sodium chloride 100 mL/hr at 03/20/16 0239    Allergies:  Allergies  Allergen Reactions  . Sulfa Antibiotics Rash    Social History   Social History  . Marital Status: Married    Spouse Name: N/A  . Number of Children: N/A  . Years of Education: N/A   Occupational History  . Not on file.   Social History Main Topics  . Smoking status: Current Every Day Smoker -- 0.50 packs/day  . Smokeless tobacco: Not on file  . Alcohol Use: No  . Drug Use: Not on file  . Sexual Activity: Not on file   Other Topics Concern  . Not on file   Social History Narrative     History reviewed. No pertinent family history.   Review of Systems Positive forFall Negative for: General:  chills, fever, night sweats or weight changes.  Cardiovascular: PND orthopnea possible syncope dizziness  Dermatological skin lesions rashes Respiratory: Cough congestion Urologic: Frequent urination urination at night and hematuria Abdominal: negative for nausea, vomiting, diarrhea, bright red blood per rectum, melena, or hematemesis Neurologic: negative for visual changes, and/or hearing changes  All other systems reviewed and are otherwise negative except as noted above.  Labs:  Recent Labs  03/19/16  1436 03/20/16 1044  CKTOTAL 9369* 4426*  TROPONINI <0.03  --    Lab Results  Component Value Date   WBC 10.6 03/20/2016   HGB 13.4 03/20/2016   HCT 40.2 03/20/2016   MCV 86.7 03/20/2016   PLT 164 03/20/2016    Recent Labs Lab 03/19/16 1436 03/20/16 1044  NA 138 135  K 3.4* 3.9  CL 108 112*  CO2 23 16*  BUN 26* 17  CREATININE 0.66 0.58  CALCIUM 8.3* 8.4*   PROT 6.5  --   BILITOT 1.2  --   ALKPHOS 71  --   ALT 39  --   AST 118*  --   GLUCOSE 101* 99   No results found for: CHOL, HDL, LDLCALC, TRIG No results found for: DDIMER  Radiology/Studies:  Dg Chest 1 View  03/19/2016  CLINICAL DATA:  Pain following fall EXAM: CHEST 1 VIEW COMPARISON:  July 26, 2015 FINDINGS: There is no edema or consolidation. Heart is upper normal in size with pulmonary vascularity within normal limits. Pacemaker leads are attached to the right atrium and right ventricle. No adenopathy. No pneumothorax. No bone lesions. IMPRESSION: Pacemaker leads are attached to the right atrium and right ventricle. No edema or consolidation. Electronically Signed   By: Bretta Bang III M.D.   On: 03/19/2016 15:16   Dg Pelvis 1-2 Views  03/19/2016  CLINICAL DATA:  Fall.  Weakness and altered mental status EXAM: PELVIS - 1-2 VIEW COMPARISON:  None. FINDINGS: Both hips in normal alignment. No fracture in the hips or pelvis. Joint space narrowing and degenerative change in both hip joints. Surgical clips in the region of the rectum. IMPRESSION: Negative for fracture. Electronically Signed   By: Marlan Palau M.D.   On: 03/19/2016 15:14   Ct Head Wo Contrast  03/19/2016  CLINICAL DATA:  75 year old female with acute altered mental status, fall and weakness. EXAM: CT HEAD WITHOUT CONTRAST TECHNIQUE: Contiguous axial images were obtained from the base of the skull through the vertex without intravenous contrast. COMPARISON:  04/02/2014 and prior CTs FINDINGS: Moderate to severe atrophy and chronic small-vessel white matter ischemic changes are again noted. No acute intracranial abnormalities are identified, including mass lesion or mass effect, hydrocephalus, extra-axial fluid collection, midline shift, hemorrhage, or acute infarction. The visualized bony calvarium is unremarkable. IMPRESSION: No evidence of acute intracranial abnormality. Moderate to severe cerebral atrophy and chronic  small-vessel white matter ischemic changes. Electronically Signed   By: Harmon Pier M.D.   On: 03/19/2016 17:21    EKG: Normal sinus rhythm. Normal EKG  Weights: Filed Weights   03/19/16 1427 03/19/16 1953  Weight: 175 lb (79.379 kg) 175 lb 6 oz (79.55 kg)     Physical Exam: Blood pressure 139/60, pulse 59, temperature 99 F (37.2 C), temperature source Oral, resp. rate 18, height  (1.651 m), weight 175 lb 6 oz (79.55 kg), SpO2 100 %. Body mass index is 29.18 kg/(m^2). General: Well developed, well nourished, in no acute distress. Head eyes ears nose throat: Normocephalic, atraumatic, sclera non-icteric, no xanthomas, nares are without discharge. No apparent thyromegaly and/or mass  Lungs: Normal respiratory effort.  no wheezes, no rales, no rhonchi.  Heart: RRR with normal S1 S2. no murmur gallop, no rub, PMI is normal size and placement, carotid upstroke normal without bruit, jugular venous pressure is normal Abdomen: Soft, non-tender, non-distended with normoactive bowel sounds. No hepatomegaly. No rebound/guarding. No obvious abdominal masses. Abdominal aorta is normal size without bruit Extremities:Trace edema. no  cyanosis, no clubbing, no ulcers  Peripheral : 2+ bilateral upper extremity pulses, 2+ bilateral femoral pulses, 2+ bilateral dorsal pedal pulse Neuro: Alert and oriented. No facial asymmetry. No focal deficit. Moves all extremities spontaneously. Musculoskeletal: Normal muscle tone without kyphosis Psych:  Responds to questions appropriately with a normal affect.    Assessment: 75 year old female with apparent previous depression and symptomatic bradycardia status post pacemaker placement with an EKG showing normal sinus rhythm and no evidence of heart attack and/or congestive heart failure having a fall and unable to get up with possibility of syncope but somewhat unclear  Plan: 1. Echocardiogram for LV systolic dysfunction valvular heart disease contributing to  above 2. Consider pacemaker interrogation for further evaluation and treatment of if necessary 3. Further hydration and nutrition for elevated creatinine kinase 4. Further treatment options after above  Signed, Lamar Blinks M.D. [facility] Cardiology 03/20/2016, 1:09 PM

## 2016-03-20 NOTE — Care Management (Addendum)
Admitted to [facility] with the diagnosis of Rhabdomyolysis. Lives alone in a con[address]um on [address]. Son is Onalee HuaDavid [phone]), lives in [place].  No other children. One brother who lives in [place].  Friend is Genava [phone]) Friend checks on her and does errands.  Dr. Marcello FennelHande is listed as primary care physician. States she doesn't remember the last time she was at his office. No home health. No skilled facility. Uses a rolling walker and cane as needed for ambulation. Life Alert in the home, but not activated. Good appetite. States she has had no falls prior to the fall that brought her to the hospital. States she takes care of all basic activities of daily living herself, drives occasionally.  Friend will transport.  Gwenette GreetBrenda S Maize Brittingham RN MSN CCM Care Management [phone]

## 2016-03-20 NOTE — Evaluation (Signed)
Physical Therapy Evaluation Patient Details Name: Donna Dodson MRN: [id number] DOB: 1941/05/29 Today's Date: 03/20/2016   History of Present Illness  presented to ER secondary to fall at home, found by neighbors on floor for approximately 2 days; admitted with acute rhabdomyolysis.  CT head negative for acute change/injury.  Clinical Impression  Upon evaluation, patient alert and oriented to basic information, though globally confused to more complex information (problem-solving, safety awareness).  Demonstrates moderate weakness of R UE/LE, but denies sensory deficit.  Noted difficulty functionally utilizing R hemi-body with all mobility efforts throughout evaluation.  Currently requiring mod assist for all functional activities (bed mobility, sit/stand, short-distance gait with RW); very poor balance and reaction time.  High fall risk; unsafe to return home alone at this time. Would benefit from skilled PT to address above deficits and promote optimal return to PLOF; recommend transition to STR upon discharge from acute hospitalization.     Follow Up Recommendations SNF    Equipment Recommendations       Recommendations for Other Services       Precautions / Restrictions Precautions Precautions: Fall Precaution Comments: PPM Restrictions Weight Bearing Restrictions: No      Mobility  Bed Mobility Overal bed mobility: Needs Assistance Bed Mobility: Supine to Sit     Supine to sit: Min assist;Mod assist     General bed mobility comments: initial min assist to attain static sitting balance (due to posterior lean); able to maintain with close sup once accommodated to position  Transfers Overall transfer level: Needs assistance Equipment used: Rolling walker (2 wheeled) Transfers: Sit to/from Stand Sit to Stand: Mod assist            Ambulation/Gait Ambulation/Gait assistance: Mod assist Ambulation Distance (Feet): 3 Feet Assistive device: Rolling walker (2  wheeled)       General Gait Details: broad BOS, poor ant/lateral weight shift bilat; marked difficutly with advancement and clearance of R LE.  Poor balance; unsafe to complete without RW and hands-on assist +1.  Stairs            Wheelchair Mobility    Modified Rankin (Stroke Patients Only)       Balance Overall balance assessment: Needs assistance Sitting-balance support: No upper extremity supported;Feet supported Sitting balance-Leahy Scale: Fair     Standing balance support: Bilateral upper extremity supported Standing balance-Leahy Scale: Poor                               Pertinent Vitals/Pain Pain Assessment: No/denies pain    Home Living Family/patient expects to be discharged to:: Private residence Living Arrangements: Alone Available Help at Discharge: Friend(s);Available PRN/intermittently Type of Home: House Home Access: Level entry     Home Layout: One level Home Equipment: Walker - standard      Prior Function Level of Independence: Independent         Comments: Indep without assist device for household mobility and ADLs; denies fall history outside of this event.  + driving prior to admission.     Hand Dominance        Extremity/Trunk Assessment   Upper Extremity Assessment:  (noted weakness in R UE, 3+/5, compared to L UE, 4+/5.  Denies sensory deficit)           Lower Extremity Assessment:  (Noted weakness in R LE, 3+/5, compared to L LE, 4+/5.  Denies sensory deficit.  Positive Babinski bilat.)  Communication   Communication: No difficulties  Cognition Arousal/Alertness: Awake/alert Behavior During Therapy: WFL for tasks assessed/performed Overall Cognitive Status: Difficult to assess (globally confused with noted impairments in safety and problem-solving, task initiation)       Memory: Decreased short-term memory              General Comments      Exercises Other Exercises Other  Exercises: Gait x5' with RW, min/mod assist--cuing to negotiate RW (patient consistently pulling hands off RW).  Very short, shuffling steps (R > L) with poor weight shift, dynamic balance.  Unable to tolerate additional distance secondary to fatigue.      Assessment/Plan    PT Assessment Patient needs continued PT services  PT Diagnosis Difficulty walking;Generalized weakness   PT Problem List Decreased strength;Decreased activity tolerance;Decreased balance;Decreased mobility;Decreased coordination;Decreased cognition;Decreased knowledge of use of DME;Decreased safety awareness;Decreased knowledge of precautions  PT Treatment Interventions DME instruction;Gait training;Functional mobility training;Therapeutic activities;Therapeutic exercise;Balance training;Neuromuscular re-education;Cognitive remediation;Patient/family education   PT Goals (Current goals can be found in the Care Plan section) Acute Rehab PT Goals Patient Stated Goal: difficulty verbalizing formal goal; agreeable to idea of rehab PT Goal Formulation: With patient Time For Goal Achievement: 04/03/16 Potential to Achieve Goals: Good    Frequency Min 2X/week   Barriers to discharge Decreased caregiver support      Co-evaluation               End of Session Equipment Utilized During Treatment: Gait belt Activity Tolerance: Patient limited by fatigue Patient left: in chair;with call bell/phone within reach;with chair alarm set Nurse Communication: Mobility status         Time: [id number] PT Time Calculation (min) (ACUTE ONLY): 25 min   Charges:   PT Evaluation $PT Eval Moderate Complexity: 1 Procedure PT Treatments $Gait Training: 8-22 mins   PT G Codes:        Rickiya Picariello H. Manson PasseyBrown, PT, DPT, NCS 03/20/2016, 3:36 PM [phone]

## 2016-03-21 DIAGNOSIS — M6282 Rhabdomyolysis: Secondary | ICD-10-CM | POA: Diagnosis not present

## 2016-03-21 LAB — BASIC METABOLIC PANEL
Anion gap: 6 (ref 5–15)
BUN: 10 mg/dL (ref 6–20)
CALCIUM: 8.3 mg/dL — AB (ref 8.9–10.3)
CO2: 20 mmol/L — ABNORMAL LOW (ref 22–32)
Chloride: 111 mmol/L (ref 101–111)
Creatinine, Ser: 0.5 mg/dL (ref 0.44–1.00)
GFR calc Af Amer: >60 mL/min (ref >60)
GLUCOSE: 91 mg/dL (ref 65–99)
POTASSIUM: 3.8 mmol/L (ref 3.5–5.1)
Sodium: 137 mmol/L (ref 135–145)

## 2016-03-21 LAB — CK: CK TOTAL: 2768 U/L — AB (ref 38–234)

## 2016-03-21 NOTE — Progress Notes (Signed)
[facility] Cardiology [facility] Encounter Note  Patient: Donna Dodson / Admit Date: 03/19/2016 / Date of Encounter: 03/21/2016, 4:40 PM   Subjective: Patient has been resting comfortably. No evidence of heart failure rhythm disturbances chest pain. No further syncope Interrogation of pacemaker showed normal pacemaker function with baseline normal sinus rhythm and normal conduction. No evidence of rhythm disturbances or switches have occurred during the last several months of interrogation of pacemaker suggesting no rhythm causing syncopal episode  Review of Systems: Positive for: Syncope Negative for: Vision change, hearing change, syncope, dizziness, nausea, vomiting,diarrhea, bloody stool, stomach pain, cough, congestion, diaphoresis, urinary frequency, urinary pain,skin lesions, skin rashes Others previously listed  Objective: Telemetry: Normal sinus rhythm Physical Exam: Blood pressure 125/68, pulse 66, temperature 98.2 F (36.8 C), temperature source Oral, resp. rate 18, height  (1.651 m), weight 175 lb 6 oz (79.55 kg), SpO2 97 %. Body mass index is 29.18 kg/(m^2). General: Well developed, well nourished, in no acute distress. Head: Normocephalic, atraumatic, sclera non-icteric, no xanthomas, nares are without discharge. Neck: No apparent masses Lungs: Normal respirations with no wheezes, no rhonchi, no rales , no crackles   Heart: Regular rate and rhythm, normal S1 S2, no murmur, no rub, no gallop, PMI is normal size and placement, carotid upstroke normal without bruit, jugular venous pressure normal Abdomen: Soft, non-tender, non-distended with normoactive bowel sounds. No hepatosplenomegaly. Abdominal aorta is normal size without bruit Extremities: No edema, no clubbing, no cyanosis, no ulcers,  Peripheral: 2+ radial, 2+ femoral, 2+ dorsal pedal pulses Neuro: Alert and oriented. Moves all extremities spontaneously. Psych:  Responds to questions appropriately with a normal  affect.   Intake/Output Summary (Last 24 hours) at 03/21/16 1640 Last data filed at 03/21/16 1424  Gross per 24 hour  Intake    560 ml  Output      0 ml  Net    560 ml    Inpatient Medications:  . docusate sodium  100 mg Oral BID  . enoxaparin (LOVENOX) injection  40 mg Subcutaneous Q24H  . oxybutynin  10 mg Oral Daily   Infusions:  . sodium chloride 100 mL/hr at 03/21/16 1150    Labs:  Recent Labs  03/19/16 2100 03/20/16 1044 03/21/16 0411  NA  --  135 137  K  --  3.9 3.8  CL  --  112* 111  CO2  --  16* 20*  GLUCOSE  --  99 91  BUN  --  17 10  CREATININE  --  0.58 0.50  CALCIUM  --  8.4* 8.3*  MG 2.0  --   --     Recent Labs  03/19/16 1436  AST 118*  ALT 39  ALKPHOS 71  BILITOT 1.2  PROT 6.5  ALBUMIN 3.2*    Recent Labs  03/19/16 1436 03/20/16 1044  WBC 14.8* 10.6  NEUTROABS 13.1*  --   HGB 12.9 13.4  HCT 38.4 40.2  MCV 85.9 86.7  PLT 181 164    Recent Labs  03/19/16 1436 03/20/16 1044 03/21/16 0411  CKTOTAL 9369* 4426* 2768*  TROPONINI <0.03  --   --    Invalid input(s): POCBNP No results for input(s): HGBA1C in the last 72 hours.   Weights: Filed Weights   03/19/16 1427 03/19/16 1953  Weight: 175 lb (79.379 kg) 175 lb 6 oz (79.55 kg)     Radiology/Studies:  Dg Chest 1 View  03/19/2016  CLINICAL DATA:  Pain following fall EXAM: CHEST 1 VIEW COMPARISON:  July 26, 2015 FINDINGS: There is no edema or consolidation. Heart is upper normal in size with pulmonary vascularity within normal limits. Pacemaker leads are attached to the right atrium and right ventricle. No adenopathy. No pneumothorax. No bone lesions. IMPRESSION: Pacemaker leads are attached to the right atrium and right ventricle. No edema or consolidation. Electronically Signed   By: Bretta BangWilliam  Woodruff III M.D.   On: 03/19/2016 15:16   Dg Pelvis 1-2 Views  03/19/2016  CLINICAL DATA:  Fall.  Weakness and altered mental status EXAM: PELVIS - 1-2 VIEW COMPARISON:  None.  FINDINGS: Both hips in normal alignment. No fracture in the hips or pelvis. Joint space narrowing and degenerative change in both hip joints. Surgical clips in the region of the rectum. IMPRESSION: Negative for fracture. Electronically Signed   By: Marlan Palauharles  Clark M.D.   On: 03/19/2016 15:14   Ct Head Wo Contrast  03/19/2016  CLINICAL DATA:  75 year old female with acute altered mental status, fall and weakness. EXAM: CT HEAD WITHOUT CONTRAST TECHNIQUE: Contiguous axial images were obtained from the base of the skull through the vertex without intravenous contrast. COMPARISON:  04/02/2014 and prior CTs FINDINGS: Moderate to severe atrophy and chronic small-vessel white matter ischemic changes are again noted. No acute intracranial abnormalities are identified, including mass lesion or mass effect, hydrocephalus, extra-axial fluid collection, midline shift, hemorrhage, or acute infarction. The visualized bony calvarium is unremarkable. IMPRESSION: No evidence of acute intracranial abnormality. Moderate to severe cerebral atrophy and chronic small-vessel white matter ischemic changes. Electronically Signed   By: Harmon PierJeffrey  Hu M.D.   On: 03/19/2016 17:21   [place] Carotid Bilateral  03/20/2016  CLINICAL DATA:  Cerebrovascular accident. EXAM: BILATERAL CAROTID DUPLEX ULTRASOUND TECHNIQUE: Wallace CullensGray scale imaging, color Doppler and duplex ultrasound were performed of bilateral carotid and vertebral arteries in the neck. COMPARISON:  None. FINDINGS: Criteria: Quantification of carotid stenosis is based on velocity parameters that correlate the residual internal carotid diameter with NASCET-based stenosis levels, using the diameter of the distal internal carotid lumen as the denominator for stenosis measurement. The following velocity measurements were obtained: RIGHT ICA:  68/14 cm/sec CCA:  80/12 cm/sec SYSTOLIC ICA/CCA RATIO:  0.8 DIASTOLIC ICA/CCA RATIO:  1.2 ECA:  98 cm/sec LEFT ICA:  70/21 cm/sec CCA:  71/11 cm/sec SYSTOLIC  ICA/CCA RATIO:  1.1 DIASTOLIC ICA/CCA RATIO:  1.8 ECA:  79 cm/sec RIGHT CAROTID ARTERY: Minimal plaque formation is noted in the right carotid bulb and proximal right internal carotid artery without significant stenosis. RIGHT VERTEBRAL ARTERY:  Antegrade flow is noted. LEFT CAROTID ARTERY: No significant plaque or stenosis is noted in the left cervical carotid arteries. LEFT VERTEBRAL ARTERY:  Antegrade flow is noted. IMPRESSION: No hemodynamically significant stenosis or plaque is noted in either cervical carotid artery. Electronically Signed   By: Lupita RaiderJames  Green Jr, M.D.   On: 03/20/2016 16:36     Assessment and Recommendation  75 y.o. female with previous history of symptomatic bradycardia status post pacemaker placement currently functioning normally without evidence of rhythm disturbances by pacemaker interrogation and essential hypertension not requiring additional medication management with the patient who had a fall and able to get up the CK elevation now improving 1. No further cardiac investigation due to above 2. Continue pacemaker interrogation in 6 months 3. Begin ambulation and follow for other reasons for fall issues 4. Call if further questions  Signed, Arnoldo HookerBruce Lennie Vasco M.D. FACC

## 2016-03-21 NOTE — Clinical Social Work Note (Signed)
Clinical Social Work Assessment  Patient Details  Name: Donna Dodson MRN: [id number] Date of Birth: 06/21/1941  Date of referral:  03/21/16               Reason for consult:  Facility Placement                Permission sought to share information with:  Facility Medical sales representativeContact Representative, Family Supports Permission granted to share information::  Yes, Verbal Permission Granted  Name::        Agency::     Relationship::     Contact Information:     Housing/Transportation Living arrangements for the past 2 months:  Single Family Home Source of Information:  Patient Patient Interpreter Needed:  None Criminal Activity/Legal Involvement Pertinent to Current Situation/Hospitalization:  No - Comment as needed Significant Relationships:  Adult Children Lives with:    Do you feel safe going back to the place where you live?  Yes Need for family participation in patient care:  No (Coment)  Care giving concerns:  Patient resides alone and PT is recommending STR.   Social Worker assessment / plan:  CSW spoke with patient regarding PT recommendation for STR. Patient states she lives alone and that she is in agreement for STR at this time. Patient states she has not gone to rehab before. CSW extended bed offers and patient has [facility]. [facility]. Patient reports she has a son that lives in [place].  Employment status:  Retired Database administratornsurance information:  Managed Medicare PT Recommendations:  Skilled Nursing Facility Information / Referral to community resources:  Skilled Nursing Facility  Patient/Family's Response to care:  Patient expressed appreciation for CSW assistance.  Patient/Family's Understanding of and Emotional Response to Diagnosis, Current Treatment, and Prognosis:  Patient has insight that she cannot return home alone at this time.  Emotional Assessment Appearance:  Appears older than stated  age Attitude/Demeanor/Rapport:   (pleasant and cooperative) Affect (typically observed):  Accepting, Adaptable, Calm Orientation:  Oriented to Self, Oriented to Place, Oriented to  Time, Oriented to Situation Alcohol / Substance use:  Not Applicable Psych involvement (Current and /or in the community):  No (Comment)  Discharge Needs  Concerns to be addressed:  Care Coordination Readmission within the last 30 days:  No Current discharge risk:  None Barriers to Discharge:  No Barriers Identified   York SpanielMonica Lilya Smitherman, LCSW 03/21/2016, 12:27 PM

## 2016-03-21 NOTE — Progress Notes (Signed)
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

## 2016-03-21 NOTE — Care Management Important Message (Signed)
Important Message  Patient Details  Name: Quincy SimmondsJudith H Anding MRN: [id number] Date of Birth: 09/24/1940   Medicare Important Message Given:  Yes    Olegario MessierKathy A Elzie Knisley 03/21/2016, 11:25 AM

## 2016-03-21 NOTE — Discharge Summary (Signed)
[facility] [place] [facility]   PATIENT NAME: Donna Dodson    MR#:  [id number]  DATE OF BIRTH:  [birth year]  DATE OF ADMISSION:  03/19/2016 ADMITTING PHYSICIAN: Shaune Pollack, MD  DATE OF DISCHARGE: 03/22/2016  PRIMARY CARE PHYSICIAN: Barbette Reichmann, MD    ADMISSION DIAGNOSIS:  Non-traumatic rhabdomyolysis [M62.82]  DISCHARGE DIAGNOSIS:  Principal Problem:   Rhabdomyolysis   SECONDARY DIAGNOSIS:   Past Medical History  Diagnosis Date  . Pacemaker   . Depressed     HOSPITAL COURSE:    75 y/o female with pacemaker Who was found lying on the floor for 2 days and to have rhabdomyolysis.  1. Rhabdomyolysis: This is due to patient lying on the ground In her home for 2 days. CK is still elevated and therefore patient will need to continue IV fluids.   2. Fall/?? Syncope: Unclear etiology of her being found on the ground. Echo and carotid Doppler are essentially normal. MRI is pending. Cardiology is aware that her pacemaker needs to be interrogated.   3. Leukocytosis: This is likely stress reaction and dehydration. White blood cell count has improved.   PT consult is recommending skilled nursing facility   DISCHARGE CONDITIONS AND DIET:   stable regular diet  CONSULTS OBTAINED:  Treatment Team:  Lamar Blinks, MD  DRUG ALLERGIES:   Allergies  Allergen Reactions  . Sulfa Antibiotics Rash    DISCHARGE MEDICATIONS:   Current Discharge Medication List    CONTINUE these medications which have NOT CHANGED   Details  atorvastatin (LIPITOR) 20 MG tablet Take 1 tablet by mouth at bedtime.    oxybutynin (DITROPAN-XL) 10 MG 24 hr tablet Take 1 tablet by mouth daily.    Vitamin D, Ergocalciferol, (DRISDOL) 50000 units CAPS capsule Take 1 capsule by mouth every 7 (seven) days.              Today   CHIEF COMPLAINT:  No issues overnight   VITAL SIGNS:  Blood pressure 137/70, pulse 65, temperature 98.6 F (37 C), temperature  source Oral, resp. rate 20, height  (1.651 m), weight 79.55 kg (175 lb 6 oz), SpO2 96 %.   REVIEW OF SYSTEMS:  Review of Systems  Musculoskeletal: Positive for falls.     PHYSICAL EXAMINATION:  GENERAL:  75 y.o.-year-old patient lying in the bed with no acute distress.  NECK:  Supple, no jugular venous distention. No thyroid enlargement, no tenderness.  LUNGS: Normal breath sounds bilaterally, no wheezing, rales,rhonchi  No use of accessory muscles of respiration.  CARDIOVASCULAR: S1, S2 normal. No murmurs, rubs, or gallops.  ABDOMEN: Soft, non-tender, non-distended. Bowel sounds present. No organomegaly or mass.  EXTREMITIES: No pedal edema, cyanosis, or clubbing.  PSYCHIATRIC: The patient is alert and oriented x 3.  SKIN: No obvious rash, lesion, or ulcer.   DATA REVIEW:   CBC  Recent Labs Lab 03/20/16 1044  WBC 10.6  HGB 13.4  HCT 40.2  PLT 164    Chemistries   Recent Labs Lab 03/19/16 1436 03/19/16 2100  03/21/16 0411  NA 138  --   < > 137  K 3.4*  --   < > 3.8  CL 108  --   < > 111  CO2 23  --   < > 20*  GLUCOSE 101*  --   < > 91  BUN 26*  --   < > 10  CREATININE 0.66  --   < > 0.50  CALCIUM 8.3*  --   < >  8.3*  MG  --  2.0  --   --   AST 118*  --   --   --   ALT 39  --   --   --   ALKPHOS 71  --   --   --   BILITOT 1.2  --   --   --   < > = values in this interval not displayed.  Cardiac Enzymes  Recent Labs Lab 03/19/16 1436  TROPONINI <0.03    Microbiology Results  @[id number] @  RADIOLOGY:  Dg Chest 1 View  03/19/2016  CLINICAL DATA:  Pain following fall EXAM: CHEST 1 VIEW COMPARISON:  July 26, 2015 FINDINGS: There is no edema or consolidation. Heart is upper normal in size with pulmonary vascularity within normal limits. Pacemaker leads are attached to the right atrium and right ventricle. No adenopathy. No pneumothorax. No bone lesions. IMPRESSION: Pacemaker leads are attached to the right atrium and right ventricle. No edema or  consolidation. Electronically Signed   By: Bretta BangWilliam  Woodruff III M.D.   On: 03/19/2016 15:16   Dg Pelvis 1-2 Views  03/19/2016  CLINICAL DATA:  Fall.  Weakness and altered mental status EXAM: PELVIS - 1-2 VIEW COMPARISON:  None. FINDINGS: Both hips in normal alignment. No fracture in the hips or pelvis. Joint space narrowing and degenerative change in both hip joints. Surgical clips in the region of the rectum. IMPRESSION: Negative for fracture. Electronically Signed   By: Marlan Palauharles  Clark M.D.   On: 03/19/2016 15:14   Ct Head Wo Contrast  03/19/2016  CLINICAL DATA:  75 year old female with acute altered mental status, fall and weakness. EXAM: CT HEAD WITHOUT CONTRAST TECHNIQUE: Contiguous axial images were obtained from the base of the skull through the vertex without intravenous contrast. COMPARISON:  04/02/2014 and prior CTs FINDINGS: Moderate to severe atrophy and chronic small-vessel white matter ischemic changes are again noted. No acute intracranial abnormalities are identified, including mass lesion or mass effect, hydrocephalus, extra-axial fluid collection, midline shift, hemorrhage, or acute infarction. The visualized bony calvarium is unremarkable. IMPRESSION: No evidence of acute intracranial abnormality. Moderate to severe cerebral atrophy and chronic small-vessel white matter ischemic changes. Electronically Signed   By: Harmon PierJeffrey  Hu M.D.   On: 03/19/2016 17:21   [place] Carotid Bilateral  03/20/2016  CLINICAL DATA:  Cerebrovascular accident. EXAM: BILATERAL CAROTID DUPLEX ULTRASOUND TECHNIQUE: Wallace CullensGray scale imaging, color Doppler and duplex ultrasound were performed of bilateral carotid and vertebral arteries in the neck. COMPARISON:  None. FINDINGS: Criteria: Quantification of carotid stenosis is based on velocity parameters that correlate the residual internal carotid diameter with NASCET-based stenosis levels, using the diameter of the distal internal carotid lumen as the denominator for stenosis  measurement. The following velocity measurements were obtained: RIGHT ICA:  68/14 cm/sec CCA:  80/12 cm/sec SYSTOLIC ICA/CCA RATIO:  0.8 DIASTOLIC ICA/CCA RATIO:  1.2 ECA:  98 cm/sec LEFT ICA:  70/21 cm/sec CCA:  71/11 cm/sec SYSTOLIC ICA/CCA RATIO:  1.1 DIASTOLIC ICA/CCA RATIO:  1.8 ECA:  79 cm/sec RIGHT CAROTID ARTERY: Minimal plaque formation is noted in the right carotid bulb and proximal right internal carotid artery without significant stenosis. RIGHT VERTEBRAL ARTERY:  Antegrade flow is noted. LEFT CAROTID ARTERY: No significant plaque or stenosis is noted in the left cervical carotid arteries. LEFT VERTEBRAL ARTERY:  Antegrade flow is noted. IMPRESSION: No hemodynamically significant stenosis or plaque is noted in either cervical carotid artery. Electronically Signed   By: Lupita RaiderJames  Green Jr, M.D.   On: 03/20/2016  16:36      Management plans discussed with the patient and she is in agreement. Stable for discharge SNF  Patient should follow up with PCP   CODE STATUS:     Code Status Orders        Start     Ordered   03/19/16 1958  Full code   Continuous     03/19/16 1957    Code Status History    Date Active Date Inactive Code Status Order ID Comments User Context   This patient has a current code status but no historical code status.      TOTAL TIME TAKING CARE OF THIS PATIENT: 35 minutes.    Note: This dictation was prepared with Dragon dictation along with smaller phrase technology. Any transcriptional errors that result from this process are unintentional.  Osamu Olguin M.D on 03/21/2016 at 11:58 AM  Between 7am to 6pm - Pager - [phone] After 6pm go to [email or web address] - Social research officer, government  [facility]  Office  [phone]  CC: Primary care physician; Barbette Reichmann, MD

## 2016-03-21 NOTE — Clinical Social Work Placement (Signed)
   CLINICAL SOCIAL WORK PLACEMENT  NOTE  Date:  03/21/2016  Patient Details  Name: Donna Dodson MRN: [id number] Date of Birth: 10/09/1940  Clinical Social Work is seeking post-discharge placement for this patient at the Skilled  Nursing Facility level of care (*CSW will initial, date and re-position this form in  chart as items are completed):  Yes   Patient/family provided with [place] Clinical Social Work Department's list of facilities offering this level of care within the geographic area requested by the patient (or if unable, by the patient's family).  Yes   Patient/family informed of their freedom to choose among providers that offer the needed level of care, that participate in Medicare, Medicaid or managed care program needed by the patient, have an available bed and are willing to accept the patient.  Yes   Patient/family informed of [place]'s ownership interest in [facility] and [facility], as well as of the fact that they are under no obligation to receive care at these facilities.  PASRR submitted to EDS on 03/21/16     PASRR number received on 03/21/16     Existing PASRR number confirmed on       FL2 transmitted to all facilities in geographic area requested by pt/family on 03/21/16     FL2 transmitted to all facilities within larger geographic area on       Patient informed that his/her managed care company has contracts with or will negotiate with certain facilities, including the following:        Yes   Patient/family informed of bed offers received.  Patient chooses bed at  [facility]     Physician recommends and patient chooses bed at  [facility](SNF)    Patient to be transferred to  [facility]) on  .  Patient to be transferred to facility by       Patient family notified on   of transfer.  Name of family member notified:        PHYSICIAN       Additional Comment:    _______________________________________________ Shawntia Mangal,  Ladon ApplebaumBailey G, LCSW 03/21/2016, 4:37 PM

## 2016-03-21 NOTE — Progress Notes (Signed)
[facility] [place] [facility]   PATIENT NAME: Donna PriestJudith Dodson    MR#:  [id number]  DATE OF BIRTH:  09/03/1941  SUBJECTIVE:   Physical therapy without the patient exhibited right-sided weakness and therefore MRI has been ordered to rule out CVA. Etiology of fall with possible syncope is unclear at this time. Patient is doing well herself. She voices no complaints this morning.  REVIEW OF SYSTEMS:    Review of Systems  Constitutional: Negative for fever, chills and malaise/fatigue.  HENT: Negative for ear discharge, ear pain, hearing loss, nosebleeds and sore throat.   Eyes: Negative for blurred vision and pain.  Respiratory: Negative for cough, hemoptysis, shortness of breath and wheezing.   Cardiovascular: Negative for chest pain, palpitations and leg swelling.  Gastrointestinal: Negative for nausea, vomiting, abdominal pain, diarrhea and blood in stool.  Genitourinary: Negative for dysuria.  Musculoskeletal: Positive for falls. Negative for back pain.  Neurological: Negative for dizziness, tremors, speech change, focal weakness, seizures and headaches.  Endo/Heme/Allergies: Does not bruise/bleed easily.  Psychiatric/Behavioral: Negative for depression, suicidal ideas and hallucinations.    Tolerating Diet: yes      DRUG ALLERGIES:   Allergies  Allergen Reactions  . Sulfa Antibiotics Rash    VITALS:  Blood pressure 137/70, pulse 65, temperature 98.6 F (37 C), temperature source Oral, resp. rate 20, height 5\' 5"  (1.651 m), weight 79.55 kg (175 lb 6 oz), SpO2 96 %.  PHYSICAL EXAMINATION:   Physical Exam  Constitutional: She is oriented to person, place, and time and well-developed, well-nourished, and in no distress. No distress.  HENT:  Head: Normocephalic.  Eyes: No scleral icterus.  Neck: Normal range of motion. Neck supple. No JVD present. No tracheal deviation present.  Cardiovascular: Normal rate, regular rhythm and normal heart sounds.  Exam  reveals no gallop and no friction rub.   No murmur heard. Pulmonary/Chest: Effort normal and breath sounds normal. No respiratory distress. She has no wheezes. She has no rales. She exhibits no tenderness.  Abdominal: Soft. Bowel sounds are normal. She exhibits no distension and no mass. There is no tenderness. There is no rebound and no guarding.  Musculoskeletal: Normal range of motion. She exhibits no edema.  Neurological: She is alert and oriented to person, place, and time.  Skin: Skin is warm. No rash noted. No erythema.  Psychiatric: Affect and judgment normal.      LABORATORY PANEL:   CBC  Recent Labs Lab 03/20/16 1044  WBC 10.6  HGB 13.4  HCT 40.2  PLT 164   ------------------------------------------------------------------------------------------------------------------  Chemistries   Recent Labs Lab 03/19/16 1436 03/19/16 2100  03/21/16 0411  NA 138  --   < > 137  K 3.4*  --   < > 3.8  CL 108  --   < > 111  CO2 23  --   < > 20*  GLUCOSE 101*  --   < > 91  BUN 26*  --   < > 10  CREATININE 0.66  --   < > 0.50  CALCIUM 8.3*  --   < > 8.3*  MG  --  2.0  --   --   AST 118*  --   --   --   ALT 39  --   --   --   ALKPHOS 71  --   --   --   BILITOT 1.2  --   --   --   < > = values in  this interval not displayed. ------------------------------------------------------------------------------------------------------------------  Cardiac Enzymes  Recent Labs Lab 03/19/16 1436  TROPONINI <0.03   ------------------------------------------------------------------------------------------------------------------  RADIOLOGY:  Dg Chest 1 View  03/19/2016  CLINICAL DATA:  Pain following fall EXAM: CHEST 1 VIEW COMPARISON:  July 26, 2015 FINDINGS: There is no edema or consolidation. Heart is upper normal in size with pulmonary vascularity within normal limits. Pacemaker leads are attached to the right atrium and right ventricle. No adenopathy. No pneumothorax. No  bone lesions. IMPRESSION: Pacemaker leads are attached to the right atrium and right ventricle. No edema or consolidation. Electronically Signed   By: Bretta Bang III M.D.   On: 03/19/2016 15:16   Dg Pelvis 1-2 Views  03/19/2016  CLINICAL DATA:  Fall.  Weakness and altered mental status EXAM: PELVIS - 1-2 VIEW COMPARISON:  None. FINDINGS: Both hips in normal alignment. No fracture in the hips or pelvis. Joint space narrowing and degenerative change in both hip joints. Surgical clips in the region of the rectum. IMPRESSION: Negative for fracture. Electronically Signed   By: Marlan Palau M.D.   On: 03/19/2016 15:14   Ct Head Wo Contrast  03/19/2016  CLINICAL DATA:  75 year old female with acute altered mental status, fall and weakness. EXAM: CT HEAD WITHOUT CONTRAST TECHNIQUE: Contiguous axial images were obtained from the base of the skull through the vertex without intravenous contrast. COMPARISON:  04/02/2014 and prior CTs FINDINGS: Moderate to severe atrophy and chronic small-vessel white matter ischemic changes are again noted. No acute intracranial abnormalities are identified, including mass lesion or mass effect, hydrocephalus, extra-axial fluid collection, midline shift, hemorrhage, or acute infarction. The visualized bony calvarium is unremarkable. IMPRESSION: No evidence of acute intracranial abnormality. Moderate to severe cerebral atrophy and chronic small-vessel white matter ischemic changes. Electronically Signed   By: Harmon Pier M.D.   On: 03/19/2016 17:21   US Carotid Bilateral  03/20/2016  CLINICAL DATA:  Cerebrovascular accident. EXAM: BILATERAL CAROTID DUPLEX ULTRASOUND TECHNIQUE: Wallace Cullens scale imaging, color Doppler and duplex ultrasound were performed of bilateral carotid and vertebral arteries in the neck. COMPARISON:  None. FINDINGS: Criteria: Quantification of carotid stenosis is based on velocity parameters that correlate the residual internal carotid diameter with NASCET-based  stenosis levels, using the diameter of the distal internal carotid lumen as the denominator for stenosis measurement. The following velocity measurements were obtained: RIGHT ICA:  68/14 cm/sec CCA:  80/12 cm/sec SYSTOLIC ICA/CCA RATIO:  0.8 DIASTOLIC ICA/CCA RATIO:  1.2 ECA:  98 cm/sec LEFT ICA:  70/21 cm/sec CCA:  71/11 cm/sec SYSTOLIC ICA/CCA RATIO:  1.1 DIASTOLIC ICA/CCA RATIO:  1.8 ECA:  79 cm/sec RIGHT CAROTID ARTERY: Minimal plaque formation is noted in the right carotid bulb and proximal right internal carotid artery without significant stenosis. RIGHT VERTEBRAL ARTERY:  Antegrade flow is noted. LEFT CAROTID ARTERY: No significant plaque or stenosis is noted in the left cervical carotid arteries. LEFT VERTEBRAL ARTERY:  Antegrade flow is noted. IMPRESSION: No hemodynamically significant stenosis or plaque is noted in either cervical carotid artery. Electronically Signed   By: Lupita Raider, M.D.   On: 03/20/2016 16:36     ASSESSMENT AND PLAN:   75 y/o female with pacemaker Who was found lying on the floor for 2 days and to have rhabdomyolysis.  1. Rhabdomyolysis: This is due to patient lying on the ground In her home for 2 days. CK is still elevated and therefore patient will need to continue IV fluids.   2. Fall/?? Syncope: Unclear etiology of her being  found on the ground. Echo and carotid Doppler are essentially normal. MRI is pending. Cardiology is aware that her pacemaker needs to be interrogated.   3. Leukocytosis: This is likely stress reaction and dehydration. White blood cell count has improved.   PT consult is recommending skilled nursing facility.  Management plans discussed with the patient and she is in agreement.  CODE STATUS: full  TOTAL TIME TAKING CARE OF THIS PATIENT: 25 minutes.     POSSIBLE D/C 1-2 day, DEPENDING ON CLINICAL CONDITION.   Brynlee Pennywell M.D on 03/21/2016 at 11:54 AM  Between 7am to 6pm - Pager - [phone] After 6pm go to [email or web address] -  password [employer]  [facility] [place] [facility]  Office  [phone]  CC: Primary care physician; Barbette Reichmann, MD  Note: This dictation was prepared with Dragon dictation along with smaller phrase technology. Any transcriptional errors that result from this process are unintentional.

## 2016-03-21 NOTE — [place] FL2 (Signed)
[place] MEDICAID FL2 LEVEL OF CARE SCREENING TOOL     IDENTIFICATION  Patient Name: Quincy SimmondsJudith H Amason Birthdate: 02/07/1941 Sex: female Admission Date (Current Location): 03/19/2016  [facility] [place]   Facility and Address:  [facility], [address], [place], [place] [address]      Provider Number: [id number]  Attending Physician Name and Address:  Adrian SaranSital Mody, MD  Relative Name and Phone Number:       Current Level of Care: Hospital Recommended Level of Care: Skilled Nursing Facility Prior Approval Number:    Date Approved/Denied:   PASRR Number:    Discharge Plan: SNF    Current Diagnoses: Patient Active Problem List   Diagnosis Date Noted  . Rhabdomyolysis 03/19/2016    Orientation RESPIRATION BLADDER Height & Weight     Self, Situation, Place  Normal Incontinent Weight: 175 lb 6 oz (79.55 kg) Height:  5\' 5"  (165.1 cm)  BEHAVIORAL SYMPTOMS/MOOD NEUROLOGICAL BOWEL NUTRITION STATUS   (none )  (none ) Continent Diet (Diet: Heart Healthy )  AMBULATORY STATUS COMMUNICATION OF NEEDS Skin   Extensive Assist Verbally Normal                       Personal Care Assistance Level of Assistance  Bathing, Feeding, Dressing Bathing Assistance: Limited assistance Feeding assistance: Independent Dressing Assistance: Limited assistance     Functional Limitations Info  Sight, Hearing, Speech Sight Info: Adequate Hearing Info: Adequate Speech Info: Adequate    SPECIAL CARE FACTORS FREQUENCY  PT (By licensed PT), OT (By licensed OT)     PT Frequency:  (5) OT Frequency:  (5)            Contractures      Additional Factors Info  Code Status, Allergies Code Status Info:  (Full Code. ) Allergies Info:  (Sulfa Antibiotics)           Current Medications (03/21/2016):  This is the current hospital active medication list Current Facility-Administered Medications  Medication Dose Route Frequency Provider Last  Rate Last Dose  . 0.9 %  sodium chloride infusion   Intravenous Continuous Shaune PollackQing Chen, MD 100 mL/hr at 03/20/16 2351    . acetaminophen (TYLENOL) tablet 650 mg  650 mg Oral Q6H PRN Shaune PollackQing Chen, MD       Or  . acetaminophen (TYLENOL) suppository 650 mg  650 mg Rectal Q6H PRN Shaune PollackQing Chen, MD      . albuterol (PROVENTIL) (2.5 MG/3ML) 0.083% nebulizer solution 2.5 mg  2.5 mg Nebulization Q2H PRN Shaune PollackQing Chen, MD      . docusate sodium (COLACE) capsule 100 mg  100 mg Oral BID Shaune PollackQing Chen, MD   100 mg at 03/21/16 0835  . enoxaparin (LOVENOX) injection 40 mg  40 mg Subcutaneous Q24H Shaune PollackQing Chen, MD   40 mg at 03/20/16 2053  . HYDROcodone-acetaminophen (NORCO/VICODIN) 5-325 MG per tablet 1-2 tablet  1-2 tablet Oral Q4H PRN Shaune PollackQing Chen, MD      . ondansetron [facility](ZOFRAN) tablet 4 mg  4 mg Oral Q6H PRN Shaune PollackQing Chen, MD       Or  . ondansetron [facility](ZOFRAN) injection 4 mg  4 mg Intravenous Q6H PRN Shaune PollackQing Chen, MD      . oxybutynin (DITROPAN-XL) 24 hr tablet 10 mg  10 mg Oral Daily Shaune PollackQing Chen, MD   10 mg at 03/21/16 0834  . senna-docusate (Senokot-S) tablet 1 tablet  1 tablet Oral QHS PRN Shaune PollackQing Chen, MD  Discharge Medications: Please see discharge summary for a list of discharge medications.  Relevant Imaging Results:  Relevant Lab Results:   Additional Information  (SSN: [id number])  Shiro Ellerman, Ladon Applebaum, LCSW

## 2016-03-22 LAB — CK: Total CK: 1006 U/L — ABNORMAL HIGH (ref 38–234)

## 2016-03-22 NOTE — Progress Notes (Signed)
[facility] - [place] at [facility]   PATIENT NAME: Donna Dodson    MR#:  [id number]  DATE OF BIRTH:  May 09, 1941  SUBJECTIVE:   Patient's feeling weak but denies any other complaints   REVIEW OF SYSTEMS:    Review of Systems  Constitutional: Negative for fever, chills and malaise/fatigue.  HENT: Negative for ear discharge, ear pain, hearing loss, nosebleeds and sore throat.   Eyes: Negative for blurred vision and pain.  Respiratory: Negative for cough, hemoptysis, shortness of breath and wheezing.   Cardiovascular: Negative for chest pain, palpitations and leg swelling.  Gastrointestinal: Negative for nausea, vomiting, abdominal pain, diarrhea and blood in stool.  Genitourinary: Negative for dysuria.  Musculoskeletal: Positive for falls. Negative for back pain.  Neurological: Negative for dizziness, tremors, speech change, focal weakness, seizures and headaches.  Endo/Heme/Allergies: Does not bruise/bleed easily.  Psychiatric/Behavioral: Negative for depression, suicidal ideas and hallucinations.    Tolerating Diet: yes      DRUG ALLERGIES:   Allergies  Allergen Reactions  . Sulfa Antibiotics Rash    VITALS:  Blood pressure 145/73, pulse 60, temperature 98 F (36.7 C), temperature source Oral, resp. rate 16, height  (1.651 m), weight 79.55 kg (175 lb 6 oz), SpO2 98 %.  PHYSICAL EXAMINATION:   Physical Exam  Constitutional: She is oriented to person, place, and time and well-developed, well-nourished, and in no distress. No distress.  HENT:  Head: Normocephalic.  Eyes: No scleral icterus.  Neck: Normal range of motion. Neck supple. No JVD present. No tracheal deviation present.  Cardiovascular: Normal rate, regular rhythm and normal heart sounds.  Exam reveals no gallop and no friction rub.   No murmur heard. Pulmonary/Chest: Effort normal and breath sounds normal. No respiratory distress. She has no wheezes. She has no rales. She exhibits no  tenderness.  Abdominal: Soft. Bowel sounds are normal. She exhibits no distension and no mass. There is no tenderness. There is no rebound and no guarding.  Musculoskeletal: Normal range of motion. She exhibits no edema.  Neurological: She is alert and oriented to person, place, and time.  Skin: Skin is warm. No rash noted. No erythema.  Psychiatric: Affect and judgment normal.      LABORATORY PANEL:   CBC  Recent Labs Lab 03/20/16 1044  WBC 10.6  HGB 13.4  HCT 40.2  PLT 164   ------------------------------------------------------------------------------------------------------------------  Chemistries   Recent Labs Lab 03/19/16 1436 03/19/16 2100  03/21/16 0411  NA 138  --   < > 137  K 3.4*  --   < > 3.8  CL 108  --   < > 111  CO2 23  --   < > 20*  GLUCOSE 101*  --   < > 91  BUN 26*  --   < > 10  CREATININE 0.66  --   < > 0.50  CALCIUM 8.3*  --   < > 8.3*  MG  --  2.0  --   --   AST 118*  --   --   --   ALT 39  --   --   --   ALKPHOS 71  --   --   --   BILITOT 1.2  --   --   --   < > = values in this interval not displayed. ------------------------------------------------------------------------------------------------------------------  Cardiac Enzymes  Recent Labs Lab 03/19/16 1436  TROPONINI <0.03   ------------------------------------------------------------------------------------------------------------------  RADIOLOGY:  US Carotid Bilateral  03/20/2016  CLINICAL DATA:  Cerebrovascular accident. EXAM: BILATERAL CAROTID DUPLEX ULTRASOUND TECHNIQUE: Wallace CullensGray scale imaging, color Doppler and duplex ultrasound were performed of bilateral carotid and vertebral arteries in the neck. COMPARISON:  None. FINDINGS: Criteria: Quantification of carotid stenosis is based on velocity parameters that correlate the residual internal carotid diameter with NASCET-based stenosis levels, using the diameter of the distal internal carotid lumen as the denominator for stenosis  measurement. The following velocity measurements were obtained: RIGHT ICA:  68/14 cm/sec CCA:  80/12 cm/sec SYSTOLIC ICA/CCA RATIO:  0.8 DIASTOLIC ICA/CCA RATIO:  1.2 ECA:  98 cm/sec LEFT ICA:  70/21 cm/sec CCA:  71/11 cm/sec SYSTOLIC ICA/CCA RATIO:  1.1 DIASTOLIC ICA/CCA RATIO:  1.8 ECA:  79 cm/sec RIGHT CAROTID ARTERY: Minimal plaque formation is noted in the right carotid bulb and proximal right internal carotid artery without significant stenosis. RIGHT VERTEBRAL ARTERY:  Antegrade flow is noted. LEFT CAROTID ARTERY: No significant plaque or stenosis is noted in the left cervical carotid arteries. LEFT VERTEBRAL ARTERY:  Antegrade flow is noted. IMPRESSION: No hemodynamically significant stenosis or plaque is noted in either cervical carotid artery. Electronically Signed   By: Lupita RaiderJames  Green Jr, M.D.   On: 03/20/2016 16:36     ASSESSMENT AND PLAN:   75 y/o female with pacemaker Who was found lying on the floor for 2 days and to have rhabdomyolysis.  1. Rhabdomyolysis: This is due to patient lying on the ground In her home for 2 days.CPK continues to be elevated continue IV hydration and repeat CK in the morning  2. Fall/?? Syncope: Unclear etiology of her being found on the ground. Echo and carotid Doppler are essentially normal. 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 Cardiology seen the patient in turn graded the pacemaker without any evidence of any arrhythmias  3. Leukocytosis: This is likely stress reaction and dehydration. White blood cell count has normalized  4. Disposition to rehabilitation soon once CPKs better   Management plans discussed with the patient and she is in agreement.  CODE STATUS: full  TOTAL TIME TAKING CARE OF THIS PATIENT: 25 minutes.     POSSIBLE D/C 1-2 day, DEPENDING ON CLINICAL CONDITION.   Auburn BilberryPATEL, Falesha Schommer M.D on 03/22/2016 at 11:52 AM  Between 7am to 6pm - Pager - [phone] After 6pm go to  [email or web address] - password [email or web address] [email or web address]  [facility] [place] [facility]  Office  [phone]  CC: Primary care physician; Barbette ReichmannHANDE,VISHWANATH, MD  Note: This dictation was prepared with Dragon dictation along with smaller phrase technology. Any transcriptional errors that result from this process are unintentional.

## 2016-03-22 NOTE — Plan of Care (Signed)
Problem: Bowel/Gastric: Goal: Will not experience complications related to bowel motility Outcome: Completed/Met Date Met:  03/22/16 Pt is alert and oriented with intermittent confusion, denies pain, bedbound at this time, pt notes that she is "scared" she is too weak to sit on side of bed. Good appetite, on room air, IV fluids infusing, CK improved, bm during shift, vital signs stable, afebrile.

## 2016-03-23 LAB — CBC
HEMATOCRIT: 37.3 % (ref 35.0–47.0)
Hemoglobin: 12.9 g/dL (ref 12.0–16.0)
MCH: 29.3 pg (ref 26.0–34.0)
MCHC: 34.6 g/dL (ref 32.0–36.0)
MCV: 84.7 fL (ref 80.0–100.0)
Platelets: 219 10*3/uL (ref 150–440)
RBC: 4.41 MIL/uL (ref 3.80–5.20)
RDW: 14.4 % (ref 11.5–14.5)
WBC: 8.7 10*3/uL (ref 3.6–11.0)

## 2016-03-23 LAB — CK: CK TOTAL: 443 U/L — AB (ref 38–234)

## 2016-03-23 MED ORDER — ACETAMINOPHEN 325 MG PO TABS: 650.0000 mg | ORAL_TABLET | Freq: Four times a day (QID) | ORAL | Status: DC | PRN

## 2016-03-23 NOTE — Clinical Social Work Placement (Signed)
   CLINICAL SOCIAL WORK PLACEMENT  NOTE  Date:  03/23/2016  Patient Details  Name: Donna Dodson MRN: [id number] Date of Birth: 11/10/1940  Clinical Social Work is seeking post-discharge placement for this patient at the Skilled  Nursing Facility level of care (*CSW will initial, date and re-position this form in  chart as items are completed):  Yes   Patient/family provided with [place] Clinical Social Work Department's list of facilities offering this level of care within the geographic area requested by the patient (or if unable, by the patient's family).  Yes   Patient/family informed of their freedom to choose among providers that offer the needed level of care, that participate in Medicare, Medicaid or managed care program needed by the patient, have an available bed and are willing to accept the patient.  Yes   Patient/family informed of [facility]'s ownership interest in [facility] and [facility], as well as of the fact that they are under no obligation to receive care at these facilities.  PASRR submitted to EDS on 03/21/16     PASRR number received on 03/21/16     Existing PASRR number confirmed on       FL2 transmitted to all facilities in geographic area requested by pt/family on 03/21/16     FL2 transmitted to all facilities within larger geographic area on       Patient informed that his/her managed care company has contracts with or will negotiate with certain facilities, including the following:        Yes   Patient/family informed of bed offers received.  Patient chooses bed at  [facility]     Physician recommends and patient chooses bed at  [facility](SNF)    Patient to be transferred to  [facility]) on  .  Patient to be transferred to facility by  (EMS)     Patient family notified on 03/23/16 of transfer.  Name of family member notified:   Karen Kitchens([place] Friend)     PHYSICIAN       Additional Comment:     _______________________________________________ Idamae Lusherhristina E Zylah Elsbernd, LCSW 03/23/2016, 11:27 AM

## 2016-03-23 NOTE — Discharge Summary (Signed)
[facility] [place] at [facility]   PATIENT NAME: Donna PriestJudith Dodson    MR#:  [id number]  DATE OF BIRTH:  01/14/1941  DATE OF ADMISSION:  03/19/2016 ADMITTING PHYSICIAN: Donna PollackQing Chen, Dodson  DATE OF DISCHARGE: 03/22/2016  PRIMARY CARE PHYSICIAN: Donna ReichmannHANDE,Donna Dodson    ADMISSION DIAGNOSIS:  Non-traumatic rhabdomyolysis [M62.82]  DISCHARGE DIAGNOSIS:  Principal Problem:   Rhabdomyolysis   SECONDARY DIAGNOSIS:   Past Medical History  Diagnosis Date  . Pacemaker   . Depressed     HOSPITAL COURSE:    75 y/o female with pacemaker Who was found lying on the floor for 2 days and to have rhabdomyolysis.  1. Rhabdomyolysis: This is due to patient lying on the ground In her home for 2 days. CK is still elevated and therefore patient will need to continue IV fluids.   2. Fall/?? Syncope: Unclear etiology of her being found on the ground. Echo and carotid Doppler are essentially normal. MRI with atrophy possible [facility] neuro eval as outpatient Seen by cardiology pacemaker intrograted no arrythmia   3. Leukocytosis: This is likely stress reaction and dehydration. White blood cell count has normal   PT consult is recommending skilled nursing facility   DISCHARGE CONDITIONS AND DIET:   stable regular diet  CONSULTS OBTAINED:  Treatment Team:  Donna BlinksBruce J Kowalski, Dodson Donna BrownsYuriy Zeylikman, Dodson  DRUG ALLERGIES:   Allergies  Allergen Reactions  . Sulfa Antibiotics Rash    DISCHARGE MEDICATIONS:   Current Discharge Medication List    START taking these medications   Details  acetaminophen (TYLENOL) 325 MG tablet Take 2 tablets (650 mg total) by mouth every 6 (six) hours as needed for mild pain (or Fever >/= 101).      CONTINUE these medications which have NOT CHANGED   Details  atorvastatin (LIPITOR) 20 MG tablet Take 1 tablet by mouth at bedtime.    oxybutynin (DITROPAN-XL) 10 MG 24 hr tablet Take 1 tablet by mouth daily.    Vitamin D, Ergocalciferol, (DRISDOL)  50000 units CAPS capsule Take 1 capsule by mouth every 7 (seven) days.              Today   CHIEF COMPLAINT:  No issues overnight   VITAL SIGNS:  Blood pressure 154/70, pulse 60, temperature 98.1 F (36.7 C), temperature source Oral, resp. rate 18, height 5\' 5"  (1.651 m), weight 79.55 kg (175 lb 6 oz), SpO2 100 %.   REVIEW OF SYSTEMS:  Review of Systems  Musculoskeletal: Positive for falls.     PHYSICAL EXAMINATION:  GENERAL:  75 y.o.-year-old patient lying in the bed with no acute distress.  NECK:  Supple, no jugular venous distention. No thyroid enlargement, no tenderness.  LUNGS: Normal breath sounds bilaterally, no wheezing, rales,rhonchi  No use of accessory muscles of respiration.  CARDIOVASCULAR: S1, S2 normal. No murmurs, rubs, or gallops.  ABDOMEN: Soft, non-tender, non-distended. Bowel sounds present. No organomegaly or mass.  EXTREMITIES: No pedal edema, cyanosis, or clubbing.  PSYCHIATRIC: The patient is alert and oriented x 3.  SKIN: No obvious rash, lesion, or ulcer.   DATA REVIEW:   CBC  Recent Labs Lab 03/23/16 0540  WBC 8.7  HGB 12.9  HCT 37.3  PLT 219    Chemistries   Recent Labs Lab 03/19/16 1436 03/19/16 2100  03/21/16 0411  NA 138  --   < > 137  K 3.4*  --   < > 3.8  CL 108  --   < > 111  CO2 23  --   < > 20*  GLUCOSE 101*  --   < > 91  BUN 26*  --   < > 10  CREATININE 0.66  --   < > 0.50  CALCIUM 8.3*  --   < > 8.3*  MG  --  2.0  --   --   AST 118*  --   --   --   ALT 39  --   --   --   ALKPHOS 71  --   --   --   BILITOT 1.2  --   --   --   < > = values in this interval not displayed.  Cardiac Enzymes  Recent Labs Lab 03/19/16 1436  TROPONINI <0.03    Microbiology Results  @  RADIOLOGY:  No results found.    Management plans discussed with the patient and she is in agreement. Stable for discharge SNF  Patient should follow up with PCP   CODE STATUS:     Code Status Orders         Start     Ordered   03/19/16 1958  Full code   Continuous     03/19/16 1957    Code Status History    Date Active Date Inactive Code Status Order ID Comments User Context   This patient has a current code status but no historical code status.      TOTAL TIME TAKING CARE OF THIS PATIENT: 35 minutes.    Note: This dictation was prepared with Dragon dictation along with smaller phrase technology. Any transcriptional errors that result from this process are unintentional.  Donna Dodson M.D on 03/23/2016 at 10:53 AM  Between 7am to 6pm - Pager - [phone] After 6pm go to [email or web address] - Social research officer, government  [facility]  Office  [phone]  CC: Primary care physician; Donna Reichmann, Dodson

## 2016-03-23 NOTE — Progress Notes (Signed)
Patient aware she is being discharged to [facility] Room 505 today.  Report called to [place] over the telephone [phone]). Will call EMS once patient eats lunch, Gunnar FusiPaula aware of plan.

## 2016-03-23 NOTE — Progress Notes (Signed)
Clinical Social Worker was informed that patient will be medically ready to discharge to [facility]. Patient in a agreement with plan. CSW called Verlon AuLeslie- Admissions at [facility] to confirm that patient's bed is ready. Provided patient's room number 505 and number to call for report [phone] . All discharge information faxed to [facility] via [employer]. Call to patient's friend- Louie CasaGeneva left message to inform her patient would discharge to [facility]. RN will call report and patient will discharge to [facility] via EMS.  Woodroe Modehristina Lahna Nath, MSW, LCSW-A, LCAS-A Clinical Social Worker [phone]

## 2016-03-23 NOTE — Discharge Instructions (Signed)
°  DIET:  °Cardiac diet ° °DISCHARGE CONDITION:  °Stable ° °ACTIVITY:  °Activity as tolerated ° °OXYGEN:  °Home Oxygen: No. °  °Oxygen Delivery: room air ° °DISCHARGE LOCATION:  °nursing home  ° ° °ADDITIONAL DISCHARGE INSTRUCTION: ° ° °If you experience worsening of your admission symptoms, develop shortness of breath, life threatening emergency, suicidal or homicidal thoughts you must seek medical attention immediately by calling 911 or calling your MD immediately  if symptoms less severe. ° °You Must read complete instructions/literature along with all the possible adverse reactions/side effects for all the Medicines you take and that have been prescribed to you. Take any new Medicines after you have completely understood and accpet all the possible adverse reactions/side effects.  ° °Please note ° °You were cared for by a hospitalist during your hospital stay. If you have any questions about your discharge medications or the care you received while you were in the hospital after you are discharged, you can call the unit and asked to speak with the hospitalist on call if the hospitalist that took care of you is not available. Once you are discharged, your primary care physician will handle any further medical issues. Please note that NO REFILLS for any discharge medications will be authorized once you are discharged, as it is imperative that you return to your primary care physician (or establish a relationship with a primary care physician if you do not have one) for your aftercare needs so that they can reassess your need for medications and monitor your lab values. ° ° °

## 2016-04-10 ENCOUNTER — Inpatient Hospital Stay
Admission: EM | Admit: 2016-04-10 | Discharge: 2016-04-12 | DRG: 158 | Disposition: A | Discharge status: Skilled Nursing Facility | Payer: Medicare Other | Attending: Internal Medicine | Admitting: Internal Medicine

## 2016-04-10 ENCOUNTER — Emergency Department: Payer: Medicare Other

## 2016-04-10 DIAGNOSIS — F17210 Nicotine dependence, cigarettes, uncomplicated: Secondary | ICD-10-CM | POA: Diagnosis present

## 2016-04-10 DIAGNOSIS — L0390 Cellulitis, unspecified: Secondary | ICD-10-CM

## 2016-04-10 DIAGNOSIS — R220 Localized swelling, mass and lump, head: Secondary | ICD-10-CM | POA: Diagnosis present

## 2016-04-10 DIAGNOSIS — K122 Cellulitis and abscess of mouth: Secondary | ICD-10-CM | POA: Diagnosis present

## 2016-04-10 DIAGNOSIS — M6282 Rhabdomyolysis: Secondary | ICD-10-CM | POA: Diagnosis present

## 2016-04-10 DIAGNOSIS — Z66 Do not resuscitate: Secondary | ICD-10-CM | POA: Diagnosis present

## 2016-04-10 DIAGNOSIS — Z6827 Body mass index (BMI) 27.0-27.9, adult: Secondary | ICD-10-CM | POA: Diagnosis not present

## 2016-04-10 DIAGNOSIS — Z79899 Other long term (current) drug therapy: Secondary | ICD-10-CM

## 2016-04-10 DIAGNOSIS — I495 Sick sinus syndrome: Secondary | ICD-10-CM | POA: Diagnosis present

## 2016-04-10 DIAGNOSIS — Z882 Allergy status to sulfonamides status: Secondary | ICD-10-CM | POA: Diagnosis not present

## 2016-04-10 DIAGNOSIS — L0291 Cutaneous abscess, unspecified: Secondary | ICD-10-CM

## 2016-04-10 DIAGNOSIS — Z950 Presence of cardiac pacemaker: Secondary | ICD-10-CM | POA: Diagnosis not present

## 2016-04-10 DIAGNOSIS — M272 Inflammatory conditions of jaws: Secondary | ICD-10-CM | POA: Diagnosis present

## 2016-04-10 DIAGNOSIS — R630 Anorexia: Secondary | ICD-10-CM | POA: Diagnosis present

## 2016-04-10 LAB — CBC WITH DIFFERENTIAL/PLATELET
Basophils Absolute: 0.1 10*3/uL (ref 0–0.1)
Basophils Relative: 1 %
EOS ABS: 0.1 10*3/uL (ref 0–0.7)
EOS PCT: 1 %
HCT: 42.2 % (ref 35.0–47.0)
Hemoglobin: 14.1 g/dL (ref 12.0–16.0)
LYMPHS ABS: 1.9 10*3/uL (ref 1.0–3.6)
Lymphocytes Relative: 16 %
MCH: 28.5 pg (ref 26.0–34.0)
MCHC: 33.4 g/dL (ref 32.0–36.0)
MCV: 85.4 fL (ref 80.0–100.0)
Monocytes Absolute: 1 10*3/uL — ABNORMAL HIGH (ref 0.2–0.9)
Monocytes Relative: 9 %
Neutro Abs: 9 10*3/uL — ABNORMAL HIGH (ref 1.4–6.5)
Neutrophils Relative %: 73 %
PLATELETS: 275 10*3/uL (ref 150–440)
RBC: 4.94 MIL/uL (ref 3.80–5.20)
RDW: 13.9 % (ref 11.5–14.5)
WBC: 12.1 10*3/uL — AB (ref 3.6–11.0)

## 2016-04-10 LAB — BASIC METABOLIC PANEL
Anion gap: 12 (ref 5–15)
BUN: 13 mg/dL (ref 6–20)
CALCIUM: 9.3 mg/dL (ref 8.9–10.3)
CO2: 23 mmol/L (ref 22–32)
CREATININE: 0.66 mg/dL (ref 0.44–1.00)
Chloride: 102 mmol/L (ref 101–111)
GFR calc Af Amer: >60 mL/min (ref >60)
Glucose, Bld: 93 mg/dL (ref 65–99)
POTASSIUM: 3.8 mmol/L (ref 3.5–5.1)
SODIUM: 137 mmol/L (ref 135–145)

## 2016-04-10 MED ORDER — OXYBUTYNIN CHLORIDE ER 10 MG PO TB24
10.0000 mg | ORAL_TABLET | Freq: Every day | ORAL | Status: DC
  Administered 2016-04-10 – 2016-04-12 (×3): 10 mg via ORAL
  Filled 2016-04-10 (×3): qty 1

## 2016-04-10 MED ORDER — POLYETHYLENE GLYCOL 3350 17 G PO PACK
17.0000 g | PACK | Freq: Every day | ORAL | Status: DC | PRN
  Administered 2016-04-12: 17 g via ORAL
  Filled 2016-04-10: qty 1

## 2016-04-10 MED ORDER — SENNA 8.6 MG PO TABS
1.0000 | ORAL_TABLET | Freq: Two times a day (BID) | ORAL | Status: DC
  Administered 2016-04-10 – 2016-04-12 (×4): 8.6 mg via ORAL
  Filled 2016-04-10 (×4): qty 1

## 2016-04-10 MED ORDER — ACETAMINOPHEN 325 MG PO TABS: 650.0000 mg | ORAL_TABLET | Freq: Four times a day (QID) | ORAL | Status: DC | PRN

## 2016-04-10 MED ORDER — SODIUM CHLORIDE 0.9 % IV SOLN
INTRAVENOUS | Status: AC
  Administered 2016-04-10: 75 mL/h via INTRAVENOUS
  Administered 2016-04-11: 15:00:00 via INTRAVENOUS

## 2016-04-10 MED ORDER — ONDANSETRON HCL 4 MG PO TABS: 4.0000 mg | ORAL_TABLET | Freq: Four times a day (QID) | ORAL | Status: DC | PRN

## 2016-04-10 MED ORDER — ONDANSETRON HCL 4 MG/2ML IJ SOLN: 4.0000 mg | Freq: Four times a day (QID) | INTRAMUSCULAR | Status: DC | PRN

## 2016-04-10 MED ORDER — IBUPROFEN 400 MG PO TABS
400.0000 mg | ORAL_TABLET | Freq: Four times a day (QID) | ORAL | Status: DC | PRN
  Administered 2016-04-10: 400 mg via ORAL
  Filled 2016-04-10: qty 1

## 2016-04-10 MED ORDER — ALBUTEROL SULFATE (2.5 MG/3ML) 0.083% IN NEBU: 2.5000 mg | INHALATION_SOLUTION | RESPIRATORY_TRACT | Status: DC | PRN

## 2016-04-10 MED ORDER — DEXAMETHASONE SODIUM PHOSPHATE 10 MG/ML IJ SOLN
10.0000 mg | Freq: Once | INTRAMUSCULAR | Status: AC
  Administered 2016-04-10: 10 mg via INTRAVENOUS
  Filled 2016-04-10: qty 1

## 2016-04-10 MED ORDER — IOPAMIDOL (ISOVUE-300) INJECTION 61%
75.0000 mL | Freq: Once | INTRAVENOUS | Status: AC | PRN
  Administered 2016-04-10: 75 mL via INTRAVENOUS

## 2016-04-10 MED ORDER — SODIUM CHLORIDE 0.9 % IV SOLN
3.0000 g | Freq: Four times a day (QID) | INTRAVENOUS | Status: DC
  Administered 2016-04-11 – 2016-04-12 (×6): 3 g via INTRAVENOUS
  Filled 2016-04-10 (×9): qty 3

## 2016-04-10 MED ORDER — BISACODYL 10 MG RE SUPP: 10.0000 mg | Freq: Every day | RECTAL | Status: DC | PRN

## 2016-04-10 MED ORDER — SODIUM CHLORIDE 0.9 % IV SOLN: 250.0000 mL | INTRAVENOUS | Status: DC | PRN

## 2016-04-10 MED ORDER — LIDOCAINE-EPINEPHRINE 1 %-1:100000 IJ SOLN
5.0000 mL | Freq: Once | INTRAMUSCULAR | Status: AC
  Administered 2016-04-10: 5 mL via INTRADERMAL
  Filled 2016-04-10: qty 5

## 2016-04-10 MED ORDER — SODIUM CHLORIDE 0.9% FLUSH: 3.0000 mL | INTRAVENOUS | Status: DC | PRN

## 2016-04-10 MED ORDER — SODIUM CHLORIDE 0.9% FLUSH
3.0000 mL | Freq: Two times a day (BID) | INTRAVENOUS | Status: DC
  Administered 2016-04-10 – 2016-04-11 (×2): 3 mL via INTRAVENOUS

## 2016-04-10 MED ORDER — ENOXAPARIN SODIUM 40 MG/0.4ML ~~LOC~~ SOLN
40.0000 mg | SUBCUTANEOUS | Status: DC
  Administered 2016-04-10 – 2016-04-11 (×2): 40 mg via SUBCUTANEOUS
  Filled 2016-04-10 (×2): qty 0.4

## 2016-04-10 MED ORDER — ACETAMINOPHEN 650 MG RE SUPP: 650.0000 mg | Freq: Four times a day (QID) | RECTAL | Status: DC | PRN

## 2016-04-10 MED ORDER — LIDOCAINE-EPINEPHRINE (PF) 1 %-1:200000 IJ SOLN
INTRAMUSCULAR | Status: AC
  Filled 2016-04-10: qty 30

## 2016-04-10 MED ORDER — SODIUM CHLORIDE 0.9 % IV BOLUS (SEPSIS)
500.0000 mL | Freq: Once | INTRAVENOUS | Status: AC
  Administered 2016-04-10: 500 mL via INTRAVENOUS

## 2016-04-10 MED ORDER — SODIUM CHLORIDE 0.9 % IV SOLN
3.0000 g | Freq: Once | INTRAVENOUS | Status: AC
  Administered 2016-04-10: 3 g via INTRAVENOUS
  Filled 2016-04-10: qty 3

## 2016-04-10 MED ORDER — DOCUSATE SODIUM 100 MG PO CAPS
100.0000 mg | ORAL_CAPSULE | Freq: Two times a day (BID) | ORAL | Status: DC
  Administered 2016-04-10 – 2016-04-12 (×4): 100 mg via ORAL
  Filled 2016-04-10 (×4): qty 1

## 2016-04-10 MED ORDER — FENTANYL CITRATE (PF) 100 MCG/2ML IJ SOLN
25.0000 ug | Freq: Once | INTRAMUSCULAR | Status: AC
  Administered 2016-04-10: 25 ug via INTRAVENOUS
  Filled 2016-04-10: qty 2

## 2016-04-10 NOTE — ED Notes (Signed)
Dr. Marella Chimes at bedside to drain peri-orbital abscess.

## 2016-04-10 NOTE — Consult Note (Signed)
..   Donna Dodson, Donna Dodson [id number] 02/18/1941 Donna Sickle, MD  Reason for Consult: Odontogenic abscess  HPI: 75 y.o. Female presents with worsening neck swelling.  Seen by outside dentist and cleared for dental cause.  CT scan performed today reveals an odontogenic abscess along horizontal portion of left mandible extending down into left submental region.  Patient currently in a nursing facility and is with a caregiver.  Caregiver reports that patient is able to consent for herself.  Allergies:  Allergies  Allergen Reactions  . Sulfa Antibiotics Rash    ROS: Review of systems normal other than 12 systems except per HPI.  PMH:  Past Medical History:  Diagnosis Date  . Depressed   . Pacemaker     FH:  Family History  Problem Relation Age of Onset  . Family history unknown: Yes    SH:  Social History   Social History  . Marital status: Married    Spouse name: N/A  . Number of children: N/A  . Years of education: N/A   Occupational History  . Not on file.   Social History Main Topics  . Smoking status: Current Every Day Smoker    Packs/day: 0.50  . Smokeless tobacco: Never Used  . Alcohol use No  . Drug use: Unknown  . Sexual activity: Not on file   Other Topics Concern  . Not on file   Social History Narrative  . No narrative on file    PSH: History reviewed. No pertinent surgical history.  Physical  Exam:  GEN-  Mildly confused but aware of situation and responds appropriately NEURO-  CN 2-12 grossly intact and symmetric. EARS- EAC/TMs normal BL. OC/OP-  Poor dentition, pain with palpation of medial inferior mandible and floor of mouth.  No significant fluctuance identified intraorally.  Floor of mouth supple with no induration.  NOSE-  Nasal cavity without polyps or purulence. External nose and ears without masses or lesions.  EYE- EOMI, PERRLA.  NECK-induration and tenderness with palpation of mandible and angle of mandible.  Induration and fluctuance  near submental mandible with erythema of skin.  Procedure:  Needed I&D of Abscess-  Description of procedure:  After verbal consent obtained and risks and benefits discussed, the patient's left neck was anesthetized with 1% lidocaine with 1:100,000 epinephrine approximately 2 cc's.  An 18 gauge needle was next gently inserted into the fluctuant area and approximately 5cc's of thick purulent material was aspiration from the abscess.  Approximately another 5 cc's was expressed from the puncture site as well.  The patient did not tolerate more aspiration.   A/P: Odontogenic abscess  Plan:  1)  Abscess partially aspirated today, but limited due to patient tolerance.  Will follow culture results.  Agree with IV abx.  Medicine admission.  Will need dental/oral surgery evaluation upon discharge for correction of inciting tooth.  Will re-evaluate tomorrow to see if another aspiration is needed or not.   Alfons Sulkowski 04/10/2016 7:40 PM

## 2016-04-10 NOTE — ED Triage Notes (Signed)
Pt to ED drom [facility] via [facility] c/o jaw swelling. Per EMS pt has jaw swelling w/o abscess per dental visit. Pt states jaw has been swollen for 3 weeks and is tender to touch. Pt is alert and oriented in no acute distress at this time.

## 2016-04-10 NOTE — H&P (Signed)
[facility] - [place] at [facility]   PATIENT NAME: Donna Dodson    MR#:  [id number]  DATE OF BIRTH:  01-02-1941  DATE OF ADMISSION:  04/10/2016  PRIMARY CARE PHYSICIAN: No PCP Per Patient   REQUESTING/REFERRING PHYSICIAN: Dr. Don Perking  CHIEF COMPLAINT:   Chief Complaint  Patient presents with  . Facial Swelling    HISTORY OF PRESENT ILLNESS:  Phylicia Dodson  is a 75 y.o. female with a known history of depression, pacemaker here for left mandibular swelling. CT showed abscess and drained by ENT in ED. Needs admission for IV abx.Afebrile. Started as dental pain and progressed. Poor appetite.  PAST MEDICAL HISTORY:   Past Medical History:  Diagnosis Date  . Depressed   . Pacemaker     PAST SURGICAL HISTORY:  History reviewed. No pertinent surgical history.  SOCIAL HISTORY:   Social History  Substance Use Topics  . Smoking status: Current Every Day Smoker    Packs/day: 0.50  . Smokeless tobacco: Never Used  . Alcohol use No    FAMILY HISTORY:   Family History  Problem Relation Age of Onset  . Family history unknown: Yes    DRUG ALLERGIES:   Allergies  Allergen Reactions  . Sulfa Antibiotics Rash    REVIEW OF SYSTEMS:   Review of Systems  Constitutional: Negative for chills and fever.  HENT: Negative for sore throat.   Eyes: Negative for blurred vision, double vision and pain.  Respiratory: Negative for cough, hemoptysis, shortness of breath and wheezing.   Cardiovascular: Negative for chest pain, palpitations, orthopnea and leg swelling.  Gastrointestinal: Negative for abdominal pain, constipation, diarrhea, heartburn, nausea and vomiting.  Genitourinary: Negative for dysuria and hematuria.  Musculoskeletal: Negative for back pain and joint pain.  Skin: Negative for rash.  Neurological: Negative for sensory change, speech change, focal weakness and headaches.  Endo/Heme/Allergies: Does not bruise/bleed easily.   Psychiatric/Behavioral: Negative for depression. The patient is not nervous/anxious.     MEDICATIONS AT HOME:   Prior to Admission medications   Medication Sig Start Date End Date Taking? Authorizing Provider  acetaminophen (TYLENOL) 325 MG tablet Take 2 tablets (650 mg total) by mouth every 6 (six) hours as needed for mild pain (or Fever >/= 101). 03/23/16   Auburn Bilberry, MD  atorvastatin (LIPITOR) 20 MG tablet Take 1 tablet by mouth at bedtime. 03/28/14   Historical Provider, MD  oxybutynin (DITROPAN-XL) 10 MG 24 hr tablet Take 1 tablet by mouth daily. 10/24/14   Historical Provider, MD  Vitamin D, Ergocalciferol, (DRISDOL) 50000 units CAPS capsule Take 1 capsule by mouth every 7 (seven) days. 06/30/14   Historical Provider, MD     VITAL SIGNS:  Blood pressure 115/70, pulse 65, temperature 99.2 F (37.3 C), temperature source Oral, resp. rate 19, height 5\' 5"  (1.651 m), weight 75.7 kg (166 lb 14.2 oz), SpO2 98 %.  PHYSICAL EXAMINATION:  Physical Exam  GENERAL:  75 y.o.-year-old patient lying in the bed with no acute distress.  EYES: Pupils equal, round, reactive to light and accommodation. No scleral icterus. Extraocular muscles intact.  HEENT: Head atraumatic, normocephalic. Oropharynx and nasopharynx clear. No oropharyngeal erythema, moist oral mucosa  Tenderness left mandibular area with some swelling NECK:  Supple, no jugular venous distention. No thyroid enlargement, no tenderness.  LUNGS: Normal breath sounds bilaterally, no wheezing, rales, rhonchi. No use of accessory muscles of respiration.  CARDIOVASCULAR: S1, S2 normal. No murmurs, rubs, or gallops.  ABDOMEN: Soft, nontender, nondistended. Bowel sounds  present. No organomegaly or mass.  EXTREMITIES: No pedal edema, cyanosis, or clubbing. + 2 pedal & radial pulses b/l.   NEUROLOGIC: Cranial nerves II through XII are intact. No focal Motor or sensory deficits appreciated b/l PSYCHIATRIC: The patient is alert and oriented x  3. Good affect.  SKIN: No obvious rash, lesion, or ulcer.   LABORATORY PANEL:   CBC  Recent Labs Lab 04/10/16 1552  WBC 12.1*  HGB 14.1  HCT 42.2  PLT 275   ------------------------------------------------------------------------------------------------------------------  Chemistries   Recent Labs Lab 04/10/16 1552  NA 137  K 3.8  CL 102  CO2 23  GLUCOSE 93  BUN 13  CREATININE 0.66  CALCIUM 9.3   ------------------------------------------------------------------------------------------------------------------  Cardiac Enzymes No results for input(s): TROPONINI in the last 168 hours. ------------------------------------------------------------------------------------------------------------------  RADIOLOGY:  Ct Soft Tissue Neck W Contrast  Result Date: 04/10/2016 CLINICAL DATA:  Left perimandibular abscess and mass. EXAM: CT NECK WITH CONTRAST TECHNIQUE: Multidetector CT imaging of the neck was performed using the standard protocol following the bolus administration of intravenous contrast. CONTRAST:  75mL ISOVUE-300 IOPAMIDOL (ISOVUE-300) INJECTION 61% COMPARISON:  None. FINDINGS: Pharynx and larynx: No focal mucosal or submucosal lesions are present. The nasopharynx, oropharynx, and hypopharynx are within normal limits. Vocal cords are midline and symmetric. Salivary glands: The submandibular and parotid glands are within normal limits bilaterally. Thyroid: Negative Lymph nodes: Inflammatory a left submandibular and level 2 lymph nodes are present. No significant cervical adenopathy is evident. Vascular: No significant stenosis or vascular calcifications are present. Limited intracranial: Limited imaging the brain is within normal limits. Visualized orbits: The visualized globes and orbits are normal. Mastoids and visualized paranasal sinuses: The paranasal sinuses an mastoid air cells are clear. Skeleton: There is a peripherally enhancing fluid collection about the  horizontal ramus of the left mandible. The collection measures 1.7 x 2.0 x 5.8 cm. It extends to just below the skin surface, perforating through the platysma. There is dental disease with periapical lucency surrounding the medial mandibular incisors bilaterally is well is the left lateral incisor. No other significant posterior dental disease is present in the left mandible. No significant osseous destructive changes are present. The cervical spine demonstrates degenerative changed. Uncovertebral spurring an osseous foraminal narrowing is most evident at C5-6 and C6-7. Upper chest: Mild dependent atelectasis and pleural blebs are noted at the lung apices. IMPRESSION: 1. Peripherally enhancing fluid collection compatible with abscess surrounding the horizontal ramus of the left mandible. This is presumably odontogenic in origin. 2. A more complex portion of the collection extends to the skin surface. This is likely related to infection. Neoplasm is considered less likely. Sampling would be useful. 3. Dental disease noted within the mandibular incisors as described. There is no significant posterior dental disease in the left mandible. 4. Reactive type lymph nodes on the left. 5. Degenerative changes within the cervical spine. Electronically Signed   By: Marin Roberts M.D.   On: 04/10/2016 17:38     IMPRESSION AND PLAN:   * Left mandibular abscess S/p drainage by ENT Admit inpatient for IV abx. Leucocytosis needs to be followed. IVF Consult ENT Follow cx.  * DVT prophylaxis Lovenox   All the records are reviewed and case discussed with ED provider. Management plans discussed with the patient, family and they are in agreement.  CODE STATUS: DNR  TOTAL TIME TAKING CARE OF THIS PATIENT: 40 minutes.   Milagros Loll R M.D on 04/10/2016 at 8:56 PM  Between 7am to 6pm - Pager -  [phone]  After 6pm go to [email or web address] - password [email or web address] [facility]  [facility]  Office   [phone]  CC: Primary care physician; No PCP Per Patient  Note: This dictation was prepared with Dragon dictation along with smaller phrase technology. Any transcriptional errors that result from this process are unintentional.

## 2016-04-10 NOTE — Progress Notes (Signed)
ANTIBIOTIC CONSULT NOTE - INITIAL  Pharmacy Consult for Unasyn  Indication: surgical prophylaxis and abscess  Allergies  Allergen Reactions  . Sulfa Antibiotics Rash    Patient Measurements: Height: 5\' 5"  (165.1 cm) Weight: 166 lb 14.2 oz (75.7 kg) IBW/kg (Calculated) : 57 Adjusted Body Weight:   Vital Signs: Temp: 99.2 F (37.3 C) (08/03 1535) Temp Source: Oral (08/03 1535) BP: 115/70 (08/03 1845) Pulse Rate: 65 (08/03 1845) Intake/Output from previous day: No intake/output data recorded. Intake/Output from this shift: No intake/output data recorded.  Labs:  Recent Labs  04/10/16 1552  WBC 12.1*  HGB 14.1  PLT 275  CREATININE 0.66   Estimated Creatinine Clearance: 63.8 mL/min (by C-G formula based on SCr of 0.8 mg/dL). No results for input(s): VANCOTROUGH, VANCOPEAK, VANCORANDOM, GENTTROUGH, GENTPEAK, GENTRANDOM, TOBRATROUGH, TOBRAPEAK, TOBRARND, AMIKACINPEAK, AMIKACINTROU, AMIKACIN in the last 72 hours.   Microbiology: No results found for this or any previous visit (from the past 720 hour(s)).  Medical History: Past Medical History:  Diagnosis Date  . Depressed   . Pacemaker     Medications:  Scheduled:  . docusate sodium  100 mg Oral BID  . enoxaparin (LOVENOX) injection  40 mg Subcutaneous Q24H  . lidocaine-EPINEPHrine  5 mL Intradermal Once  . oxybutynin  10 mg Oral Daily  . senna  1 tablet Oral BID  . sodium chloride flush  3 mL Intravenous Q12H   Assessment: CrCl = 63.8 ml/min   Goal of Therapy:  resolution of infection  Plan:  Expected duration 7 days with resolution of temperature and/or normalization of WBC   Unasyn 3 gm IV Q6H ordered to start on 8/4 @ 00:30.   Damieon Armendariz D 04/10/2016,9:16 PM

## 2016-04-10 NOTE — ED Notes (Signed)
Attempted to call report; RN in a pt's room and unable to take report at this time.  Ascom number and name left with secretary to call me back.

## 2016-04-10 NOTE — ED Provider Notes (Signed)
[facility] Emergency Department Provider Note  ____________________________________________  Time seen: Approximately 3:33 PM  I have reviewed the triage vital signs and the nursing notes.   HISTORY  Chief Complaint Facial Swelling  Level 5 caveat:  Portions of the history and physical were unable to be obtained due to dementia   HPI Donna ZEIN is a 75 y.o. female a history of a pacemaker implantation, dementia, and depression who presents from her skilled nursing facility for a left jaw mass. Patient thinks that the mass has been there for 3 weeks. Review of Epic shows the patient saw her primary care doctor on 03/28/2016 and was complaining of jaw pain. She was placed on antibiotics and referred to see a dentist. According to EMS patient saw dentist recently that told her there was no infection in her mouth and no dental abscess. Therefore the facility sent patient today here for evaluation as patient continues to complain of pain in her jaw and now there is overlying redness. Patient reports mild pain at rest which increases to moderate with movement of her jaw or palpation, constant, nonradiating. She does not know she has had fevers, chills, nausea, vomiting.  I spoke to [facility], night nurse at her SNF who told me the patient has had this swelling for about 3 weeks. She finished a course of amoxicillin 5 days ago with no improvement of her symptoms. 10 days ago she saw a dentist and had x-rays and she was told that this was not coming from her teeth. On Monday the physician in house at the SNF started patient on dicloxacillin however the patient continues to have the pain and difficulty eating. She has been able to drink and has had no fevers.  Past Medical History:  Diagnosis Date  . Depressed   . Pacemaker     Patient Active Problem List   Diagnosis Date Noted  . Rhabdomyolysis 03/19/2016    History reviewed. No pertinent surgical history.  Prior  to Admission medications   Medication Sig Start Date End Date Taking? Authorizing Provider  acetaminophen (TYLENOL) 325 MG tablet Take 2 tablets (650 mg total) by mouth every 6 (six) hours as needed for mild pain (or Fever >/= 101). 03/23/16   Auburn Bilberry, MD  atorvastatin (LIPITOR) 20 MG tablet Take 1 tablet by mouth at bedtime. 03/28/14   Historical Provider, MD  oxybutynin (DITROPAN-XL) 10 MG 24 hr tablet Take 1 tablet by mouth daily. 10/24/14   Historical Provider, MD  Vitamin D, Ergocalciferol, (DRISDOL) 50000 units CAPS capsule Take 1 capsule by mouth every 7 (seven) days. 06/30/14   Historical Provider, MD    Allergies Sulfa antibiotics  Family History  Problem Relation Age of Onset  . Family history unknown: Yes    Social History Social History  Substance Use Topics  . Smoking status: Current Every Day Smoker    Packs/day: 0.50  . Smokeless tobacco: Never Used  . Alcohol use No    Review of Systems  Constitutional: Negative for fever. Eyes: Negative for visual changes. ENT: Negative for sore throat. + L jaw pain Cardiovascular: Negative for chest pain. Respiratory: Negative for shortness of breath. Gastrointestinal: Negative for abdominal pain, vomiting or diarrhea. Genitourinary: Negative for dysuria. Musculoskeletal: Negative for back pain. Skin: Negative for rash. Neurological: Negative for headaches, weakness or numbness.  ____________________________________________   PHYSICAL EXAM:  VITAL SIGNS: Vitals:   04/10/16 1630 04/10/16 1845  BP: 106/73 115/70  Pulse: 69 65  Resp: 17  19  Temp:     Constitutional: Alert and oriented x 2. Well appearing and in no apparent distress. HEENT:      Head: Normocephalic and atraumatic.         Eyes: Conjunctivae are normal. Sclera is non-icteric. EOMI. PERRL      Mouth/Throat: Hard mass located in the lower aspect of the L jaw which is extremely tender to palpation with overlying warmth and erythema. No trismus. No  evidence of dental abscess or intra-oral infection.      Neck: Supple with no signs of meningismus. Tender shotty cervical lymphadenopathy in the L Cardiovascular: Regular rate and rhythm. No murmurs, gallops, or rubs. 2+ symmetrical distal pulses are present in all extremities. No JVD. Respiratory: Normal respiratory effort. Lungs are clear to auscultation bilaterally. No wheezes, crackles, or rhonchi.  Musculoskeletal: Nontender with normal range of motion in all extremities. No edema, cyanosis, or erythema of extremities. Neurologic: Normal speech and language. Face is symmetric. Moving all extremities. No gross focal neurologic deficits are appreciated. Skin: Skin is warm, dry and intact. No rash noted. Psychiatric: Mood and affect are normal. Speech and behavior are normal.  ____________________________________________   LABS (all labs ordered are listed, but only abnormal results are displayed)  Labs Reviewed  CBC WITH DIFFERENTIAL/PLATELET - Abnormal; Notable for the following:       Result Value   WBC 12.1 (*)    Neutro Abs 9.0 (*)    Monocytes Absolute 1.0 (*)    All other components within normal limits  AEROBIC/ANAEROBIC CULTURE (SURGICAL/DEEP WOUND)  BASIC METABOLIC PANEL   ____________________________________________  EKG  ED ECG REPORT I, [name], the attending physician, personally viewed and interpreted this ECG.  Sinus rhythm, rate of 81, normal intervals, right axis deviation, low voltage QRS, T wave inversions on V2 to V4. Unchanged from prior ____________________________________________  RADIOLOGY  CT:   1. Peripherally enhancing fluid collection compatible with abscess surrounding the horizontal ramus of the left mandible. This is presumably odontogenic in origin. 2. A more complex portion of the collection extends to the skin surface. This is likely related to infection. Neoplasm is considered less likely. Sampling would be useful. 3. Dental  disease noted within the mandibular incisors as described. There is no significant posterior dental disease in the left mandible. 4. Reactive type lymph nodes on the left. 5. Degenerative changes within the cervical spine. ____________________________________________   PROCEDURES  Procedure(s) performed: None Procedures Critical Care performed:  None ____________________________________________   INITIAL IMPRESSION / ASSESSMENT AND PLAN / ED COURSE  75 y.o. female a history of a pacemaker implantation, dementia, and depression who presents from her skilled nursing facility for evaluation of tender, swollen and erythematous/ warm right jaw mass. Patient has finished a course of amoxicillin with no improvement. Currently receiving dicloxacillin for the last 3 days. No fevers. Patient has a tender hard mass located in the lower aspect of her left jaw, with overlying warmth and erythema, no evidence of infection intraorally, she also has tender shotty cervical lymphadenopathy on the left. Differential diagnoses including a jaw abscess versus malignancy . Treat her symptoms with IV set known IV fluids. We'll get basic labs and a CT of the neck.  Clinical Course  Comment By Time  ENT drained abscess and recommended admission for IV abx. Hospitalist will admit. Patient was given Unasyn and decadron. Patient remains stable. [name], MD 08/03 2025    Pertinent labs & imaging results that were available during my care of  the patient were reviewed by me and considered in my medical decision making (see chart for details).    ____________________________________________   FINAL CLINICAL IMPRESSION(S) / ED DIAGNOSES  Final diagnoses:  Abscess and cellulitis      NEW MEDICATIONS STARTED DURING THIS VISIT:  New Prescriptions   No medications on file     Note:  This document was prepared using Dragon voice recognition software and may include unintentional dictation  errors.    [name], MD 04/10/16 2026

## 2016-04-11 LAB — BASIC METABOLIC PANEL
Anion gap: 8 (ref 5–15)
BUN: 13 mg/dL (ref 6–20)
CHLORIDE: 105 mmol/L (ref 101–111)
CO2: 25 mmol/L (ref 22–32)
Calcium: 9 mg/dL (ref 8.9–10.3)
Creatinine, Ser: 0.64 mg/dL (ref 0.44–1.00)
GFR calc non Af Amer: >60 mL/min (ref >60)
Glucose, Bld: 150 mg/dL — ABNORMAL HIGH (ref 65–99)
POTASSIUM: 3.9 mmol/L (ref 3.5–5.1)
SODIUM: 138 mmol/L (ref 135–145)

## 2016-04-11 LAB — CBC
HEMATOCRIT: 40.8 % (ref 35.0–47.0)
Hemoglobin: 14.3 g/dL (ref 12.0–16.0)
MCH: 29.7 pg (ref 26.0–34.0)
MCHC: 35 g/dL (ref 32.0–36.0)
MCV: 84.9 fL (ref 80.0–100.0)
Platelets: 261 10*3/uL (ref 150–440)
RBC: 4.8 MIL/uL (ref 3.80–5.20)
RDW: 14 % (ref 11.5–14.5)
WBC: 7.7 10*3/uL (ref 3.6–11.0)

## 2016-04-11 MED ORDER — ENSURE ENLIVE PO LIQD
237.0000 mL | Freq: Two times a day (BID) | ORAL | Status: DC
  Administered 2016-04-11 – 2016-04-12 (×3): 237 mL via ORAL

## 2016-04-11 MED ORDER — IBUPROFEN 400 MG PO TABS: 400.0000 mg | ORAL_TABLET | Freq: Four times a day (QID) | ORAL | 0 refills | Status: DC | PRN

## 2016-04-11 MED ORDER — OXYCODONE-ACETAMINOPHEN 5-325 MG PO TABS
1.0000 | ORAL_TABLET | ORAL | Status: DC | PRN
  Administered 2016-04-11 – 2016-04-12 (×3): 1 via ORAL
  Filled 2016-04-11 (×3): qty 1

## 2016-04-11 MED ORDER — ENSURE ENLIVE PO LIQD: 237.0000 mL | Freq: Two times a day (BID) | ORAL | 12 refills | Status: DC

## 2016-04-11 MED ORDER — OXYCODONE-ACETAMINOPHEN 5-325 MG PO TABS: 1.0000 | ORAL_TABLET | ORAL | 0 refills | Status: DC | PRN

## 2016-04-11 MED ORDER — OXYBUTYNIN CHLORIDE ER 10 MG PO TB24: 10.0000 mg | ORAL_TABLET | Freq: Every day | ORAL | 0 refills | Status: DC

## 2016-04-11 MED ORDER — ACETAMINOPHEN 325 MG PO TABS: 650.0000 mg | ORAL_TABLET | Freq: Four times a day (QID) | ORAL | 0 refills | Status: DC | PRN

## 2016-04-11 NOTE — Progress Notes (Signed)
New Admission Note:   Arrival Method: per stretcher from ED, pt came from [employer] Orientation: alert and oriented to person, place and situation Telemetry: placed on telebox MX [id number], CCMD notified, verified with NT Sue Lush Assessment: Completed Skin: warm, dry, flaky with abrasions noted on bilateral lower legs IV: G20 on the left [facility] with transparent dressing, intact Pain: 4/10 scale on the left jaw, pt offered with PRN pain medicine but refused to have this time, stated "I will ask for it before going to bed". Safety Measures: Safety Fall Prevention Plan has been given and discussed Admission: Completed 1A Orientation: Patient has been oriented to the room, unit and staff.  Family: no family member present at bedside  Orders have been reviewed and implemented. Will continue to monitor the patient. Call light has been placed within reach and bed alarm has been activated.   Janice Norrie BSN, RN [facility] 1A

## 2016-04-11 NOTE — Evaluation (Signed)
Physical Therapy Evaluation Patient Details Name: TED ALEJANDRO MRN: [id number] DOB: 03/07/1941 Today's Date: 04/11/2016   History of Present Illness  75 y/o female who came to the ER 2 weeks ago after fall and being on the floor for 2(?) days, she had been at rehab and now returns with facial swelling/mandibular abscess.  Clinical Impression  Pt shows good effort with PT, but is very unsteady (mostly losing balance backwards) and generally showing decreased strength, awareness and activity tolerance.  Pt is not safe to return home at this time, will need continued work at rehab for gait, balance and mobility.      Follow Up Recommendations SNF    Equipment Recommendations       Recommendations for Other Services       Precautions / Restrictions Precautions Precautions: Fall Restrictions Weight Bearing Restrictions: No      Mobility  Bed Mobility Overal bed mobility: Needs Assistance Bed Mobility: Supine to Sit     Supine to sit: Mod assist     General bed mobility comments: Pt struggles to initiate getting up to EOB, definite need of assist to get up  Transfers Overall transfer level: Needs assistance Equipment used: Rolling walker (2 wheeled) Transfers: Sit to/from Stand Sit to Stand: Mod assist         General transfer comment: Pt leaning backward and struggles to lift her hips, needed a lot of cuing to shift forward and eventually gain her balance with weight forward on the walker.  Ambulation/Gait Ambulation/Gait assistance: Mod assist Ambulation Distance (Feet): 25 Feet Assistive device: Rolling walker (2 wheeled)       General Gait Details: Pt was not at all steady with ambulation and had multiple episodes of leaning backward and not being able to appropriately use the walker and needed regular mod assist to maintain balance.  Pt fatigued with the effort and also report that that was the worst she'd done walking since her last time in the  hospital  Stairs            Wheelchair Mobility    Modified Rankin (Stroke Patients Only)       Balance Overall balance assessment: Needs assistance   Sitting balance-Leahy Scale: Fair       Standing balance-Leahy Scale: Poor                               Pertinent Vitals/Pain Pain Assessment: No/denies pain    Home Living Family/patient expects to be discharged to:: Skilled nursing facility Living Arrangements: Alone Available Help at Discharge: Friend(s);Available PRN/intermittently Type of Home: House Home Access: Level entry     Home Layout: One level Home Equipment: Walker - standard      Prior Function Level of Independence: Independent         Comments: Pt has difficult time stating how much she is normally able to do     Hand Dominance        Extremity/Trunk Assessment   Upper Extremity Assessment: Generalized weakness           Lower Extremity Assessment: Generalized weakness         Communication      Cognition Arousal/Alertness: Awake/alert Behavior During Therapy: Flat affect Overall Cognitive Status: Difficult to assess                      General Comments      Exercises  Assessment/Plan    PT Assessment Patient needs continued PT services  PT Diagnosis Difficulty walking;Generalized weakness   PT Problem List Decreased strength;Decreased activity tolerance;Decreased balance;Decreased mobility;Decreased coordination;Decreased cognition;Decreased knowledge of use of DME;Decreased safety awareness;Decreased knowledge of precautions  PT Treatment Interventions DME instruction;Gait training;Functional mobility training;Therapeutic activities;Therapeutic exercise;Balance training;Neuromuscular re-education;Cognitive remediation;Patient/family education   PT Goals (Current goals can be found in the Care Plan section) Acute Rehab PT Goals Patient Stated Goal: "get stronger" PT Goal  Formulation: With patient Time For Goal Achievement: 04/25/16    Frequency Min 2X/week   Barriers to discharge        Co-evaluation               End of Session Equipment Utilized During Treatment: Gait belt Activity Tolerance: Patient limited by fatigue Patient left: with chair alarm set;with call bell/phone within reach           Time: 1325-1349 PT Time Calculation (min) (ACUTE ONLY): 24 min   Charges:   PT Evaluation $PT Eval Low Complexity: 1 Procedure     PT G CodesMalachi Pro, DPT 04/11/2016, 3:38 PM

## 2016-04-11 NOTE — Progress Notes (Signed)
[employer] [facility] [place] [facility]   PATIENT NAME: Donna Dodson    MR#:  [id number]  DATE OF BIRTH:  October 24, 1940  SUBJECTIVE:  CHIEF COMPLAINT:   Chief Complaint  Patient presents with  . Facial Swelling   - admitted for dental abscess s/p drainage in ER by ENT - awaiting cultures - poor po intake and jaw pain  REVIEW OF SYSTEMS:  Review of Systems  Constitutional: Positive for chills and malaise/fatigue. Negative for fever.  HENT: Negative for ear discharge, ear pain and nosebleeds.        Left jaw swelling  Eyes: Negative for blurred vision and double vision.  Respiratory: Negative for cough, shortness of breath and wheezing.   Cardiovascular: Negative for chest pain, palpitations and leg swelling.  Gastrointestinal: Negative for abdominal pain, constipation, diarrhea, nausea and vomiting.  Genitourinary: Negative for dysuria and urgency.  Musculoskeletal: Positive for myalgias.  Neurological: Negative for dizziness, speech change, focal weakness, seizures and headaches.  Psychiatric/Behavioral: Negative for depression.    DRUG ALLERGIES:   Allergies  Allergen Reactions  . Sulfa Antibiotics Rash    VITALS:  Blood pressure 116/63, pulse 64, temperature 97.8 F (36.6 C), temperature source Oral, resp. rate 18, height  (1.651 m), weight 73 kg (161 lb), SpO2 95 %.  PHYSICAL EXAMINATION:  Physical Exam  GENERAL:  75 y.o.-year-old patient lying in the bed with no acute distress.  EYES: Pupils equal, round, reactive to light and accommodation. No scleral icterus. Extraocular muscles intact.  HEENT: Head atraumatic, normocephalic. Left jaw swelling, s/p drainage, dressing in place. Poor oral dentition, infected teeth left lowe mandible. Otherwise Oropharynx and nasopharynx clear.  NECK:  Supple, no jugular venous distention. No thyroid enlargement, no tenderness.  LUNGS: Normal breath sounds bilaterally, no wheezing, rales,rhonchi or crepitation. No  use of accessory muscles of respiration. Decreased bibasilar breath sounds. CARDIOVASCULAR: S1, S2 normal. No murmurs, rubs, or gallops. Pacemaker in place. ABDOMEN: Soft, nontender, nondistended. Bowel sounds present. No organomegaly or mass.  EXTREMITIES: No pedal edema, cyanosis, or clubbing.  NEUROLOGIC: Cranial nerves II through XII are intact. Muscle strength 5/5 in all extremities. Sensation intact. Gait not checked. Global weakness noted PSYCHIATRIC: The patient is alert and oriented x 3.  SKIN: No obvious rash, lesion, or ulcer.    LABORATORY PANEL:   CBC  Recent Labs Lab 04/11/16 0609  WBC 7.7  HGB 14.3  HCT 40.8  PLT 261   ------------------------------------------------------------------------------------------------------------------  Chemistries   Recent Labs Lab 04/11/16 0609  NA 138  K 3.9  CL 105  CO2 25  GLUCOSE 150*  BUN 13  CREATININE 0.64  CALCIUM 9.0   ------------------------------------------------------------------------------------------------------------------  Cardiac Enzymes No results for input(s): TROPONINI in the last 168 hours. ------------------------------------------------------------------------------------------------------------------  RADIOLOGY:  Ct Soft Tissue Neck W Contrast  Result Date: 04/10/2016 CLINICAL DATA:  Left perimandibular abscess and mass. EXAM: CT NECK WITH CONTRAST TECHNIQUE: Multidetector CT imaging of the neck was performed using the standard protocol following the bolus administration of intravenous contrast. CONTRAST:  75mL ISOVUE-300 IOPAMIDOL (ISOVUE-300) INJECTION 61% COMPARISON:  None. FINDINGS: Pharynx and larynx: No focal mucosal or submucosal lesions are present. The nasopharynx, oropharynx, and hypopharynx are within normal limits. Vocal cords are midline and symmetric. Salivary glands: The submandibular and parotid glands are within normal limits bilaterally. Thyroid: Negative Lymph nodes: Inflammatory  a left submandibular and level 2 lymph nodes are present. No significant cervical adenopathy is evident. Vascular: No significant stenosis or vascular calcifications are present.  Limited intracranial: Limited imaging the brain is within normal limits. Visualized orbits: The visualized globes and orbits are normal. Mastoids and visualized paranasal sinuses: The paranasal sinuses an mastoid air cells are clear. Skeleton: There is a peripherally enhancing fluid collection about the horizontal ramus of the left mandible. The collection measures 1.7 x 2.0 x 5.8 cm. It extends to just below the skin surface, perforating through the platysma. There is dental disease with periapical lucency surrounding the medial mandibular incisors bilaterally is well is the left lateral incisor. No other significant posterior dental disease is present in the left mandible. No significant osseous destructive changes are present. The cervical spine demonstrates degenerative changed. Uncovertebral spurring an osseous foraminal narrowing is most evident at C5-6 and C6-7. Upper chest: Mild dependent atelectasis and pleural blebs are noted at the lung apices. IMPRESSION: 1. Peripherally enhancing fluid collection compatible with abscess surrounding the horizontal ramus of the left mandible. This is presumably odontogenic in origin. 2. A more complex portion of the collection extends to the skin surface. This is likely related to infection. Neoplasm is considered less likely. Sampling would be useful. 3. Dental disease noted within the mandibular incisors as described. There is no significant posterior dental disease in the left mandible. 4. Reactive type lymph nodes on the left. 5. Degenerative changes within the cervical spine. Electronically Signed   By: Marin Roberts M.D.   On: 04/10/2016 17:38    EKG:   Orders placed or performed during the hospital encounter of 04/10/16  . EKG 12-Lead  . EKG 12-Lead  . EKG 12-Lead  . EKG  12-Lead    ASSESSMENT AND PLAN:   75 year old female with past medical history significant for early dementia, depression, A. fib status post pacemaker presents to the hospital secondary to worsening left jaw pain and swelling.  #1 left mandibular abscess-noted on the CT, abscess in the soft tissues outside the left mandible. Status post drainage. Not very much drained. -Cultures are growing anaerobic organisms, gram-positive cocci and gram variable rods. -Currently on Unasyn. Continue unless MRSA is positive -Appreciate ENT consult. Likely source of the abscess is dental disease. -Follow-up with dentist as an outpatient. Patient has failed oral antibiotics for the same infection prior to admission. -Received IV Decadron in the emergency room. Not on oral steroids at this time.  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 Stable at this time.  #3 weakness and recent discharge to nursing home about 2 weeks ago. -Possible discharge back to nursing home   Physical Therapy consult for return back to rehab.  All the records are reviewed and case discussed with Care Management/Social Workerr. Management plans discussed with the patient, family and they are in agreement.  CODE STATUS: DNR   TOTAL TIME TAKING CARE OF THIS PATIENT: 37 minutes.   POSSIBLE D/C IN 1-2 DAYS, DEPENDING ON CLINICAL CONDITION.   Enid Baas M.D on 04/11/2016 at 11:09 AM  Between 7am to 6pm - Pager - [phone]  After 6pm go to [email or web address] - Social research officer, government  [facility]  Office  [phone]  CC: Primary care physician; No PCP Per Patient

## 2016-04-11 NOTE — Progress Notes (Signed)
.. 04/11/2016 8:11 AM  Donna Dodson [id number]  Post-Op Day 1    Temp:  [97.5 F (36.4 C)-99.2 F (37.3 C)] 97.8 F (36.6 C) (08/04 0739) Pulse Rate:  [62-86] 64 (08/04 0739) Resp:  [16-30] 18 (08/04 0739) BP: (104-122)/(63-82) 116/63 (08/04 0739) SpO2:  [92 %-100 %] 95 % (08/04 0739) Weight:  [73 kg (161 lb)-75.7 kg (166 lb 14.2 oz)] 73 kg (161 lb) (08/04 0503),     Intake/Output Summary (Last 24 hours) at 04/11/16 0811 Last data filed at 04/11/16 0600  Gross per 24 hour  Intake          1248.75 ml  Output                0 ml  Net          1248.75 ml    Results for orders placed or performed during the hospital encounter of 04/10/16 (from the past 24 hour(s))  CBC with Differential/Platelet     Status: Abnormal   Collection Time: 04/10/16  3:52 PM  Result Value Ref Range   WBC 12.1 (H) 3.6 - 11.0 K/uL   RBC 4.94 3.80 - 5.20 MIL/uL   Hemoglobin 14.1 12.0 - 16.0 g/dL   HCT 40.9 81.1 - 91.4 %   MCV 85.4 80.0 - 100.0 fL   MCH 28.5 26.0 - 34.0 pg   MCHC 33.4 32.0 - 36.0 g/dL   RDW 78.2 95.6 - 21.3 %   Platelets 275 150 - 440 K/uL   Neutrophils Relative % 73 %   Neutro Abs 9.0 (H) 1.4 - 6.5 K/uL   Lymphocytes Relative 16 %   Lymphs Abs 1.9 1.0 - 3.6 K/uL   Monocytes Relative 9 %   Monocytes Absolute 1.0 (H) 0.2 - 0.9 K/uL   Eosinophils Relative 1 %   Eosinophils Absolute 0.1 0 - 0.7 K/uL   Basophils Relative 1 %   Basophils Absolute 0.1 0 - 0.1 K/uL  Basic metabolic panel     Status: None   Collection Time: 04/10/16  3:52 PM  Result Value Ref Range   Sodium 137 135 - 145 mmol/L   Potassium 3.8 3.5 - 5.1 mmol/L   Chloride 102 101 - 111 mmol/L   CO2 23 22 - 32 mmol/L   Glucose, Bld 93 65 - 99 mg/dL   BUN 13 6 - 20 mg/dL   Creatinine, Ser 0.86 0.44 - 1.00 mg/dL   Calcium 9.3 8.9 - 57.8 mg/dL   GFR calc non Af Amer >60 >60 mL/min   GFR calc Af Amer >60 >60 mL/min   Anion gap 12 5 - 15  Aerobic/Anaerobic Culture (surgical/deep wound)     Status: None  (Preliminary result)   Collection Time: 04/10/16  8:05 PM  Result Value Ref Range   Specimen Description ABSCESS LEFT NECK MANDIBLE    Special Requests NONE    Gram Stain      ABUNDANT WBC PRESENT, PREDOMINANTLY PMN MODERATE GRAM POSITIVE COCCI IN PAIRS ABUNDANT GRAM VARIABLE ROD Performed at [facility]    Culture PENDING    Report Status PENDING   Basic metabolic panel     Status: Abnormal   Collection Time: 04/11/16  6:09 AM  Result Value Ref Range   Sodium 138 135 - 145 mmol/L   Potassium 3.9 3.5 - 5.1 mmol/L   Chloride 105 101 - 111 mmol/L   CO2 25 22 - 32 mmol/L   Glucose, Bld 150 (H) 65 - 99 mg/dL  BUN 13 6 - 20 mg/dL   Creatinine, Ser 4.16 0.44 - 1.00 mg/dL   Calcium 9.0 8.9 - 38.4 mg/dL   GFR calc non Af Amer >60 >60 mL/min   GFR calc Af Amer >60 >60 mL/min   Anion gap 8 5 - 15  CBC     Status: None   Collection Time: 04/11/16  6:09 AM  Result Value Ref Range   WBC 7.7 3.6 - 11.0 K/uL   RBC 4.80 3.80 - 5.20 MIL/uL   Hemoglobin 14.3 12.0 - 16.0 g/dL   HCT 53.6 46.8 - 03.2 %   MCV 84.9 80.0 - 100.0 fL   MCH 29.7 26.0 - 34.0 pg   MCHC 35.0 32.0 - 36.0 g/dL   RDW 12.2 48.2 - 50.0 %   Platelets 261 150 - 440 K/uL    SUBJECTIVE:  No acute events overnight.  Patient reports pain improved.  No difficulty swallowing.  OBJECTIVE:   GEN- NAD, supine in bed, responsive to questions and situation OC/OP-  Improved tenderness along inferior aspect of mandible in floor of mouth NECK-  Continued induration and erythema of left mandible, no significant drainage from aspiration site  IMPRESSION:  Dental/Mandibular abscess s/p Aspiration  Plan:  Follow culture results.  Gram stain reveals gram + cocci.  Improved white count despite steroids.  Given appearance on CT scan with thick rind, most likely present for some time as well as will take time for induration/fullness to resolve.  Donna Dodson 04/11/2016, 8:11 AM

## 2016-04-11 NOTE — Care Management Note (Signed)
Case Management Note  Patient Details  Name: FERNA ALTMAN MRN: [id number] Date of Birth: [birth year]  Subjective/Objective:                    Action/Plan: Anticipated discharge plan is to return to [facility].  CSW is following. Will sign off     Expected Discharge Date:                  Expected Discharge Plan:  Skilled Nursing Facility  In-House Referral:     Discharge planning Services  CM Consult  Post Acute Care Choice:    Choice offered to:     DME Arranged:    DME Agency:     HH Arranged:    HH Agency:     Status of Service:  Completed, signed off  If discussed at [employer] of Stay Meetings, dates discussed:    Additional Comments:  Adonis Huguenin, RN 04/11/2016, 3:04 PM

## 2016-04-11 NOTE — Care Management Important Message (Signed)
Important Message  Patient Details  Name: Donna Dodson MRN: [id number] Date of Birth: 1941/04/11   Medicare Important Message Given:  Yes    Adonis Huguenin, RN 04/11/2016, 11:02 AM

## 2016-04-11 NOTE — Progress Notes (Signed)
ANTIBIOTIC CONSULT NOTE - INITIAL  Pharmacy Consult for Unasyn  Indication: surgical prophylaxis and abscess  Allergies  Allergen Reactions  . Sulfa Antibiotics Rash    Patient Measurements: Height: 5\' 5"  (165.1 cm) Weight: 161 lb (73 kg) IBW/kg (Calculated) : 57 Adjusted Body Weight:   Vital Signs: Temp: 97.8 F (36.6 C) (08/04 1153) Temp Source: Oral (08/04 1153) BP: 117/67 (08/04 1153) Pulse Rate: 61 (08/04 1153) Intake/Output from previous day: 08/03 0701 - 08/04 0700 In: 1248.8 [P.O.:480; I.V.:568.8; IV Piggyback:200] Out: -  Intake/Output from this shift: No intake/output data recorded.  Labs:  Recent Labs  04/10/16 1552 04/11/16 0609  WBC 12.1* 7.7  HGB 14.1 14.3  PLT 275 261  CREATININE 0.66 0.64   Estimated Creatinine Clearance: 62.7 mL/min (by C-G formula based on SCr of 0.8 mg/dL). No results for input(s): VANCOTROUGH, VANCOPEAK, VANCORANDOM, GENTTROUGH, GENTPEAK, GENTRANDOM, TOBRATROUGH, TOBRAPEAK, TOBRARND, AMIKACINPEAK, AMIKACINTROU, AMIKACIN in the last 72 hours.   Microbiology: Recent Results (from the past 720 hour(s))  Aerobic/Anaerobic Culture (surgical/deep wound)     Status: None (Preliminary result)   Collection Time: 04/10/16  8:05 PM  Result Value Ref Range Status   Specimen Description ABSCESS LEFT NECK MANDIBLE  Final   Special Requests NONE  Final   Gram Stain   Final    ABUNDANT WBC PRESENT, PREDOMINANTLY PMN MODERATE GRAM POSITIVE COCCI IN PAIRS ABUNDANT GRAM VARIABLE ROD Performed at [facility]    Culture PENDING  Incomplete   Report Status PENDING  Incomplete    Medical History: Past Medical History:  Diagnosis Date  . Depressed   . Pacemaker     Medications:  Scheduled:  . ampicillin-sulbactam (UNASYN) IV  3 g Intravenous Q6H  . docusate sodium  100 mg Oral BID  . enoxaparin (LOVENOX) injection  40 mg Subcutaneous Q24H  . feeding supplement (ENSURE ENLIVE)  237 mL Oral BID BM  . oxybutynin  10 mg Oral  Daily  . senna  1 tablet Oral BID  . sodium chloride flush  3 mL Intravenous Q12H   Assessment: CrCl = 63.8 ml/min   Goal of Therapy:  resolution of infection  Plan:  Continue Unasyn 3 gm IV Q6H   Evone Arseneau D Krystyna Cleckley 04/11/2016,3:00 PM

## 2016-04-11 NOTE — Progress Notes (Signed)
Plan is for patient to D/C to [facility] tomorrow pending medical clearance. Per [facility] admissions coordinator at [facility] patient will go to room 506. Clinical Child psychotherapist (CSW) sent D/C orders to [facility] via [employer] today. CSW will continue to follow and assist as needed.   [employer], LCSW [phone]

## 2016-04-11 NOTE — Progress Notes (Signed)
Initial Nutrition Assessment  DOCUMENTATION CODES:   Not applicable  INTERVENTION:  1.  General healthful diet; discussed nutrition-related goals with patient.  Patient reports improvement in intake since admission which will likely continue to improve.  Continue with house trays. 2.  Supplements; Ensure Enlive po BID, each supplement provides 350 kcal and 20 grams of protein.  NUTRITION DIAGNOSIS:   Increased nutrient needs related to acute illness as evidenced by estimated needs, percent weight loss.  GOAL:   Patient will meet greater than or equal to 90% of their needs  MONITOR:   PO intake, Supplement acceptance  REASON FOR ASSESSMENT:   Malnutrition Screening Tool    ASSESSMENT:   Patient admitted with dental abscess requiring IV abx.  Patient states she has lost 20 lbs due to poor intake related to abscess, however per chart review patient is within 5-8 lbs of her usual weight.  Patient abcess has been drained and pain under better control making it easier for her to eat.  He is awaiting lunch tray at time of visit.  Breakfast tray at beside with 50% consumed.  Patient reports some improvement in appetite and intake which will likely continue to improve with resolution of infection.  Patient talks about being home alone, however per chart review she was recently d/c'd to SNF.  Continue Ensure BID as ordered and house trays to ensure meals offered/delivered.   Diet Order:  Diet regular Room service appropriate? Yes; Fluid consistency: Thin  Skin:  Reviewed, no issues  Last BM:  8/4  Height:   Ht Readings from Last 1 Encounters:  04/10/16 5\' 5"  (1.651 m)    Weight:   Wt Readings from Last 1 Encounters:  04/11/16 161 lb (73 kg)    Ideal Body Weight:     BMI:  Body mass index is 26.79 kg/m.  Estimated Nutritional Needs:   Kcal:  1650-1780  Protein:  73-82g  Fluid:  >1.8 L/day  EDUCATION NEEDS:   Education needs addressed  Loyce Dys, MS RD  LDN Clinical Inpatient Dietitian Weekend/After hours pager: [phone]

## 2016-04-11 NOTE — Progress Notes (Signed)
..   04/11/2016 5:54 PM  Donna Dodson [id number]   SUBJECTIVE:  Patient report's significant improvement over course of day and improved pain.  OBJECTIVE:  Neck- continued tenderness at drainage site but improved induration and reduction in size.  Mild drainage and fluctuance at I&D  IMPRESSION:  Mandibular abscess, improving  PLAN:  No need for further drainage at this time given significant interval improvement.  Please re-consult if any concern and adjust abx as needed for sensitivities.  Rue Tinnel 04/11/2016, 5:54 PM

## 2016-04-11 NOTE — NC FL2 (Signed)
[place] MEDICAID FL2 LEVEL OF CARE SCREENING TOOL     IDENTIFICATION  Patient Name: Donna Dodson Birthdate: Jun 22, 1941 Sex: female Admission Date (Current Location): 04/10/2016  [place] and [place] Number:  Chiropodist and Address:  [facility], [address] [place], [place] [address]      Provider Number: [id number]  Attending Physician Name and Address:  Enid Baas, MD  Relative Name and Phone Number:       Current Level of Care: Hospital Recommended Level of Care: Skilled Nursing Facility Prior Approval Number:    Date Approved/Denied:   PASRR Number:  ([id number] E ends 04/20/2016)  Discharge Plan: SNF    Current Diagnoses: Patient Active Problem List   Diagnosis Date Noted  . 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 Rhabdomyolysis 03/19/2016    Orientation RESPIRATION BLADDER Height & Weight     Self, Time, Situation, Place  Normal Incontinent Weight: 161 lb (73 kg) Height:  5\' 5"  (165.1 cm)  BEHAVIORAL SYMPTOMS/MOOD NEUROLOGICAL BOWEL NUTRITION STATUS   (none )  (none ) Continent Diet (Regular Diet )  AMBULATORY STATUS COMMUNICATION OF NEEDS Skin   Extensive Assist Verbally Normal                       Personal Care Assistance Level of Assistance  Bathing, Feeding, Dressing Bathing Assistance: Limited assistance Feeding assistance: Independent Dressing Assistance: Limited assistance     Functional Limitations Info  Sight, Hearing, Speech Sight Info: Adequate Hearing Info: Adequate Speech Info: Adequate    SPECIAL CARE FACTORS FREQUENCY  PT (By licensed PT), OT (By licensed OT)     PT Frequency:  (5) OT Frequency:  (5)            Contractures      Additional Factors Info  Code Status, Allergies Code Status Info:  (DNR ) Allergies Info:  (Sulfa Antibiotics )           Current Medications (04/11/2016):  This is the current hospital active medication list Current Facility-Administered  Medications  Medication Dose Route Frequency Provider Last Rate Last Dose  . 0.9 %  sodium chloride infusion  250 mL Intravenous PRN Srikar Sudini, MD      . 0.9 %  sodium chloride infusion   Intravenous Continuous Milagros Loll, MD 75 mL/hr at 04/10/16 2225 75 mL/hr at 04/10/16 2225  . acetaminophen (TYLENOL) tablet 650 mg  650 mg Oral Q6H PRN Milagros Loll, MD       Or  . acetaminophen (TYLENOL) suppository 650 mg  650 mg Rectal Q6H PRN Srikar Sudini, MD      . albuterol (PROVENTIL) (2.5 MG/3ML) 0.083% nebulizer solution 2.5 mg  2.5 mg Nebulization Q2H PRN Srikar Sudini, MD      . Ampicillin-Sulbactam (UNASYN) 3 g in sodium chloride 0.9 % 100 mL IVPB  3 g Intravenous Q6H Srikar Sudini, MD   3 g at 04/11/16 0536  . bisacodyl (DULCOLAX) suppository 10 mg  10 mg Rectal Daily PRN Srikar Sudini, MD      . docusate sodium (COLACE) capsule 100 mg  100 mg Oral BID Milagros Loll, MD   100 mg at 04/10/16 2230  . enoxaparin (LOVENOX) injection 40 mg  40 mg Subcutaneous Q24H Milagros Loll, MD   40 mg at 04/10/16 2229  . ibuprofen (ADVIL,MOTRIN) tablet 400 mg  400 mg Oral Q6H PRN Milagros Loll, MD   400 mg at 04/10/16 2341  . ondansetron (ZOFRAN) tablet 4 mg  4 mg Oral Q6H PRN Milagros Loll, MD       Or  . ondansetron (ZOFRAN) injection 4 mg  4 mg Intravenous Q6H PRN Srikar Sudini, MD      . oxybutynin (DITROPAN-XL) 24 hr tablet 10 mg  10 mg Oral Daily Milagros Loll, MD   10 mg at 04/10/16 2247  . polyethylene glycol (MIRALAX / GLYCOLAX) packet 17 g  17 g Oral Daily PRN Milagros Loll, MD      . senna (SENOKOT) tablet 8.6 mg  1 tablet Oral BID Milagros Loll, MD   8.6 mg at 04/10/16 2230  . sodium chloride flush (NS) 0.9 % injection 3 mL  3 mL Intravenous Q12H Milagros Loll, MD   3 mL at 04/10/16 2232  . sodium chloride flush (NS) 0.9 % injection 3 mL  3 mL Intravenous PRN Milagros Loll, MD         Discharge Medications: Please see discharge summary for a list of discharge medications.  Relevant  Imaging Results:  Relevant Lab Results:   Additional Information  (SSN: [id number])  Temesha Queener, Darleen Crocker, LCSW

## 2016-04-11 NOTE — Clinical Social Work Note (Signed)
Clinical Social Work Assessment  Patient Details  Name: Donna Dodson MRN: [id number] Date of Birth: October 20, 1940  Date of referral:  04/11/16               Reason for consult:  Facility Placement, Other (Comment Required) (From [employer] )                Permission sought to share information with:  Chartered certified accountant granted to share information::  Yes, Verbal Permission Granted  Name::      Armed forces training and education officer::   [place]   Relationship::     Contact Information:     Housing/Transportation Living arrangements for the past 2 months:  [place], [place] of Information:  Patient, Facility, Friend/Neighbor Patient Interpreter Needed:  None Criminal Activity/Legal Involvement Pertinent to Current Situation/Hospitalization:  No - Comment as needed Significant Relationships:  Adult Children, Friend Lives with:  Self, Facility Resident Do you feel safe going back to the place where you live?  Yes Need for family participation in patient care:  Yes (Comment)  Care giving concerns:  Patient came into [facility] from [facility], where she was at for short term rehab.    Social Worker assessment / plan:  Holiday representative ([place]) reviewed chart and noted that patient is from [facility]. Per [facility] admissions coordinator at [facility] patient is there for short term rehab and can return when stable. CSW met with patient alone at bedside. CSW introduced self and explained role of CSW department. Patient was alert and oriented and was sitting up in the bed. Per patient she usually lives in [place] alone and has a son in [place]. Patient is agreeable to return to [facility] when stable. CSW attempted to contacted patient's son however the phone number was busy. CSW contacted patient's friend [place] [employer]. Per Tandy Gaw patient's son does not answer and his phone is always busy. Patient's  friend is agreeable for patient to D/C back to [facility].   FL2 complete and faxed out. CSW will continue to follow and assist as needed.    Employment status:  Retired Nurse, adult PT Recommendations:  [place] / Referral to community resources:  [place]  Patient/Family's Response to care:  Patient is agreeable to return to [facility].   Patient/Family's Understanding of and Emotional Response to Diagnosis, Current Treatment, and Prognosis: Patient was pleasant and thanked CSW for visit.   Emotional Assessment Appearance:  Appears stated age Attitude/Demeanor/Rapport:    Affect (typically observed):  Accepting, Adaptable, Pleasant Orientation:  Oriented to Self, Oriented to Place, Oriented to  Time, Oriented to Situation Alcohol / Substance use:  Not Applicable Psych involvement (Current and /or in the community):  No (Comment)  Discharge Needs  Concerns to be addressed:  Discharge Planning Concerns Readmission within the last 30 days:  No Current discharge risk:  Dependent with Mobility Barriers to Discharge:  Continued Medical Work up   [employer], Veronia Beets, LCSW 04/11/2016, 2:55 PM

## 2016-04-11 NOTE — Discharge Summary (Signed)
[employer] [facility] - [place] at [facility]   PATIENT NAME: Donna Dodson    MR#:  [id number]  DATE OF BIRTH:  1941-07-23  DATE OF ADMISSION:  04/10/2016   ADMITTING PHYSICIAN: Milagros Loll, MD  DATE OF DISCHARGE: 04/12/2016  PRIMARY CARE PHYSICIAN: No PCP Per Patient   ADMISSION DIAGNOSIS:   Abscess and cellulitis [L03.90, L02.91]  DISCHARGE DIAGNOSIS:   Active Problems:   Mandibular abscess   SECONDARY DIAGNOSIS:   Past Medical History:  Diagnosis Date  . Depressed   . Pacemaker     HOSPITAL COURSE:   75 year old female with past medical history significant for early dementia, depression, A. fib status post pacemaker presents to the hospital secondary to worsening left jaw pain and swelling.  #1 left mandibular abscess-noted on the CT, abscess in the soft tissues outside the left mandible. Status post drainage. Not very much drained. -Cultures are growing anaerobic organisms, gram-positive cocci and gram variable rods. -Currently on Unasyn. Continue unless MRSA is positive - will be changed to appropriate oral antibiotics at discharge -Appreciate ENT consult. Likely source of the abscess is dental disease. -Follow-up with dentist as an outpatient. Patient has failed oral antibiotics for the same infection prior to admission. -Received IV Decadron in the emergency room. Not on oral steroids at this time.  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 Stable at this time.  #3 weakness and recent discharge to nursing home about 2 weeks ago. -Possible discharge back to nursing home   Physical Therapy consult for return back to rehab.  DISCHARGE CONDITIONS:   Guarded  CONSULTS OBTAINED:   Treatment Team:  Bud Face, MD  DRUG ALLERGIES:   Allergies  Allergen Reactions  . Sulfa Antibiotics Rash   DISCHARGE MEDICATIONS:     Medication List    TAKE these medications   acetaminophen 325 MG tablet Commonly known  as:  TYLENOL Take 2 tablets (650 mg total) by mouth every 6 (six) hours as needed for mild pain, fever or headache (or Fever >/= 101).   feeding supplement (ENSURE ENLIVE) Liqd Take 237 mLs by mouth 2 (two) times daily between meals.   ibuprofen 400 MG tablet Commonly known as:  ADVIL,MOTRIN Take 1 tablet (400 mg total) by mouth every 6 (six) hours as needed for moderate pain.   oxybutynin 10 MG 24 hr tablet Commonly known as:  DITROPAN-XL Take 1 tablet (10 mg total) by mouth daily.   oxyCODONE-acetaminophen 5-325 MG tablet Commonly known as:  PERCOCET/ROXICET Take 1 tablet by mouth every 4 (four) hours as needed for severe pain.        DISCHARGE INSTRUCTIONS:   1. ENT f/u in 2 weeks 2. Dentist f/u in 1 week 3. PCP f/u in 2 weeks  DIET:   Soft diet  ACTIVITY:   As tolerated  OXYGEN:   Home Oxygen: No.  Oxygen Delivery: room air  DISCHARGE LOCATION:   nursing home   If you experience worsening of your admission symptoms, develop shortness of breath, life threatening emergency, suicidal or homicidal thoughts you must seek medical attention immediately by calling 911 or calling your MD immediately  if symptoms less severe.  You Must read complete instructions/literature along with all the possible adverse reactions/side effects for all the Medicines you take and that have been prescribed to you. Take any new Medicines after you have completely understood and accpet all the possible adverse reactions/side effects.   Please note  You were cared for by a hospitalist during  your hospital stay. If you have any questions about your discharge medications or the care you received while you were in the hospital after you are discharged, you can call the unit and asked to speak with the hospitalist on call if the hospitalist that took care of you is not available. Once you are discharged, your primary care physician will handle any further medical issues. Please note that NO  REFILLS for any discharge medications will be authorized once you are discharged, as it is imperative that you return to your primary care physician (or establish a relationship with a primary care physician if you do not have one) for your aftercare needs so that they can reassess your need for medications and monitor your lab values.    On the day of Discharge:  VITAL SIGNS:   Blood pressure 117/67, pulse 61, temperature 97.8 F (36.6 C), temperature source Oral, resp. rate 18, height 5\' 5"  (1.651 m), weight 73 kg (161 lb), SpO2 98 %.  PHYSICAL EXAMINATION:   GENERAL:  75 y.o.-year-old patient lying in the bed with no acute distress.  EYES: Pupils equal, round, reactive to light and accommodation. No scleral icterus. Extraocular muscles intact.  HEENT: Head atraumatic, normocephalic. Left jaw swelling, s/p drainage, dressing in place. Poor oral dentition, infected teeth left lowe mandible. Otherwise Oropharynx and nasopharynx clear.  NECK:  Supple, no jugular venous distention. No thyroid enlargement, no tenderness.  LUNGS: Normal breath sounds bilaterally, no wheezing, rales,rhonchi or crepitation. No use of accessory muscles of respiration. Decreased bibasilar breath sounds. CARDIOVASCULAR: S1, S2 normal. No murmurs, rubs, or gallops. Pacemaker in place. ABDOMEN: Soft, nontender, nondistended. Bowel sounds present. No organomegaly or mass.  EXTREMITIES: No pedal edema, cyanosis, or clubbing.  NEUROLOGIC: Cranial nerves II through XII are intact. Muscle strength 5/5 in all extremities. Sensation intact. Gait not checked. Global weakness noted PSYCHIATRIC: The patient is alert and oriented x 3.  SKIN: No obvious rash, lesion, or ulcer.     DATA REVIEW:   CBC  Recent Labs Lab 04/11/16 0609  WBC 7.7  HGB 14.3  HCT 40.8  PLT 261    Chemistries   Recent Labs Lab 04/11/16 0609  NA 138  K 3.9  CL 105  CO2 25  GLUCOSE 150*  BUN 13  CREATININE 0.64  CALCIUM 9.0      Microbiology Results  Results for orders placed or performed during the hospital encounter of 04/10/16  Aerobic/Anaerobic Culture (surgical/deep wound)     Status: None (Preliminary result)   Collection Time: 04/10/16  8:05 PM  Result Value Ref Range Status   Specimen Description ABSCESS LEFT NECK MANDIBLE  Final   Special Requests NONE  Final   Gram Stain   Final    ABUNDANT WBC PRESENT, PREDOMINANTLY PMN MODERATE GRAM POSITIVE COCCI IN PAIRS ABUNDANT GRAM VARIABLE ROD Performed at [facility]    Culture PENDING  Incomplete   Report Status PENDING  Incomplete    RADIOLOGY:  Ct Soft Tissue Neck W Contrast  Result Date: 04/10/2016 CLINICAL DATA:  Left perimandibular abscess and mass. EXAM: CT NECK WITH CONTRAST TECHNIQUE: Multidetector CT imaging of the neck was performed using the standard protocol following the bolus administration of intravenous contrast. CONTRAST:  80mL ISOVUE-300 IOPAMIDOL (ISOVUE-300) INJECTION 61% COMPARISON:  None. FINDINGS: Pharynx and larynx: No focal mucosal or submucosal lesions are present. The nasopharynx, oropharynx, and hypopharynx are within normal limits. Vocal cords are midline and symmetric. Salivary glands: The submandibular and parotid glands are  within normal limits bilaterally. Thyroid: Negative Lymph nodes: Inflammatory a left submandibular and level 2 lymph nodes are present. No significant cervical adenopathy is evident. Vascular: No significant stenosis or vascular calcifications are present. Limited intracranial: Limited imaging the brain is within normal limits. Visualized orbits: The visualized globes and orbits are normal. Mastoids and visualized paranasal sinuses: The paranasal sinuses an mastoid air cells are clear. Skeleton: There is a peripherally enhancing fluid collection about the horizontal ramus of the left mandible. The collection measures 1.7 x 2.0 x 5.8 cm. It extends to just below the skin surface, perforating through the  platysma. There is dental disease with periapical lucency surrounding the medial mandibular incisors bilaterally is well is the left lateral incisor. No other significant posterior dental disease is present in the left mandible. No significant osseous destructive changes are present. The cervical spine demonstrates degenerative changed. Uncovertebral spurring an osseous foraminal narrowing is most evident at C5-6 and C6-7. Upper chest: Mild dependent atelectasis and pleural blebs are noted at the lung apices. IMPRESSION: 1. Peripherally enhancing fluid collection compatible with abscess surrounding the horizontal ramus of the left mandible. This is presumably odontogenic in origin. 2. A more complex portion of the collection extends to the skin surface. This is likely related to infection. Neoplasm is considered less likely. Sampling would be useful. 3. Dental disease noted within the mandibular incisors as described. There is no significant posterior dental disease in the left mandible. 4. Reactive type lymph nodes on the left. 5. Degenerative changes within the cervical spine. Electronically Signed   By: Marin Roberts M.D.   On: 04/10/2016 17:38     Management plans discussed with the patient, family and they are in agreement.  CODE STATUS:     Code Status Orders        Start     Ordered   04/10/16 2054  Do not attempt resuscitation (DNR)  Continuous    Question Answer Comment  In the event of cardiac or respiratory ARREST Do not call a "code blue"   In the event of cardiac or respiratory ARREST Do not perform Intubation, CPR, defibrillation or ACLS   In the event of cardiac or respiratory ARREST Use medication by any route, position, wound care, and other measures to relive pain and suffering. May use oxygen, suction and manual treatment of airway obstruction as needed for comfort.      04/10/16 2054    Code Status History    Date Active Date Inactive Code Status Order ID Comments  User Context   03/19/2016  7:57 PM 03/23/2016  5:25 PM Full Code [id number]  Shaune Pollack, MD Inpatient      TOTAL TIME TAKING CARE OF THIS PATIENT: 37  minutes.    Enid Baas M.D on 04/11/2016 at 3:21 PM  Between 7am to 6pm - Pager - [phone]  After 6pm go to [email or web address] - Scientist, research (life sciences) [facility]  Office  [phone]  CC: Primary care physician; No PCP Per Patient   Note: This dictation was prepared with Dragon dictation along with smaller phrase technology. Any transcriptional errors that result from this process are unintentional.

## 2016-04-12 LAB — BASIC METABOLIC PANEL
ANION GAP: 10 (ref 5–15)
BUN: 14 mg/dL (ref 6–20)
CHLORIDE: 105 mmol/L (ref 101–111)
CO2: 25 mmol/L (ref 22–32)
CREATININE: 0.54 mg/dL (ref 0.44–1.00)
Calcium: 9.2 mg/dL (ref 8.9–10.3)
GFR calc non Af Amer: >60 mL/min (ref >60)
Glucose, Bld: 106 mg/dL — ABNORMAL HIGH (ref 65–99)
POTASSIUM: 3.4 mmol/L — AB (ref 3.5–5.1)
SODIUM: 140 mmol/L (ref 135–145)

## 2016-04-12 MED ORDER — AMOXICILLIN-POT CLAVULANATE 875-125 MG PO TABS
1.0000 | ORAL_TABLET | Freq: Two times a day (BID) | ORAL | Status: DC
  Administered 2016-04-12: 1 via ORAL
  Filled 2016-04-12: qty 1

## 2016-04-12 MED ORDER — AMOXICILLIN-POT CLAVULANATE 875-125 MG PO TABS: 1.0000 | ORAL_TABLET | Freq: Two times a day (BID) | ORAL | 0 refills | Status: DC

## 2016-04-12 NOTE — Discharge Summary (Signed)
[facility] - [place] at [facility]   PATIENT NAME: Donna Dodson    MR#:  [id number]  DATE OF BIRTH:  Jan 24, 1941  DATE OF ADMISSION:  04/10/2016   ADMITTING PHYSICIAN: Milagros Loll, MD  DATE OF DISCHARGE: 04/12/2016  PRIMARY CARE PHYSICIAN: No PCP Per Patient   ADMISSION DIAGNOSIS:   Abscess and cellulitis [L03.90, L02.91]  DISCHARGE DIAGNOSIS:    Left Mandibular abscess s/p I and D  SECONDARY DIAGNOSIS:   Past Medical History:  Diagnosis Date  . Depressed   . Pacemaker     HOSPITAL COURSE:   75 year old female with past medical history significant for early dementia, depression, A. fib status post pacemaker presents to the hospital secondary to worsening left jaw pain and swelling.  #1 left mandibular abscess-noted on the CT, abscess in the soft tissues outside the left mandible. Status post drainage. Not very much drained. -Cultures are growing anaerobic organisms, gram-positive cocci and gram variable rods.per lab looks like strep species.  -Currently on Unasyn---change to po augmentin -Appreciate ENT consult. Likely source of the abscess is dental disease. -Follow-up with dentist as an outpatient. Patient has failed oral antibiotics for the same infection prior to admission. -Received IV Decadron in the emergency room. Not on oral steroids at this time.  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 Stable at this time.  #3 weakness and recent discharge to nursing home about 2 weeks ago. - discharge back to nursing home   Physical Therapy consult for return back to rehab. Overall improved. D/c today  DISCHARGE CONDITIONS:   Guarded  CONSULTS OBTAINED:   Treatment Team:  Bud Face, MD  DRUG ALLERGIES:   Allergies  Allergen Reactions  . Sulfa Antibiotics Rash   DISCHARGE MEDICATIONS:     Medication List    TAKE these medications   acetaminophen 325 MG tablet Commonly known as:  TYLENOL Take 2  tablets (650 mg total) by mouth every 6 (six) hours as needed for mild pain, fever or headache (or Fever >/= 101).   amoxicillin-clavulanate 875-125 MG tablet Commonly known as:  AUGMENTIN Take 1 tablet by mouth every 12 (twelve) hours.   feeding supplement (ENSURE ENLIVE) Liqd Take 237 mLs by mouth 2 (two) times daily between meals.   ibuprofen 400 MG tablet Commonly known as:  ADVIL,MOTRIN Take 1 tablet (400 mg total) by mouth every 6 (six) hours as needed for moderate pain.   oxybutynin 10 MG 24 hr tablet Commonly known as:  DITROPAN-XL Take 1 tablet (10 mg total) by mouth daily.        DISCHARGE INSTRUCTIONS:   1. ENT f/u in 2 weeks 2. Dentist f/u in 1 week 3. PCP f/u in 2 weeks  DIET:   Soft diet  ACTIVITY:   As tolerated  OXYGEN:   Home Oxygen: No.  Oxygen Delivery: room air  DISCHARGE LOCATION:   nursing home   If you experience worsening of your admission symptoms, develop shortness of breath, life threatening emergency, suicidal or homicidal thoughts you must seek medical attention immediately by calling 911 or calling your MD immediately  if symptoms less severe.  You Must read complete instructions/literature along with all the possible adverse reactions/side effects for all the Medicines you take and that have been prescribed to you. Take any new Medicines after you have completely understood and accpet all the possible adverse reactions/side effects.   Please note  You were cared for by a hospitalist during your hospital stay. If you have  any questions about your discharge medications or the care you received while you were in the hospital after you are discharged, you can call the unit and asked to speak with the hospitalist on call if the hospitalist that took care of you is not available. Once you are discharged, your primary care physician will handle any further medical issues. Please note that NO REFILLS for any discharge medications will be  authorized once you are discharged, as it is imperative that you return to your primary care physician (or establish a relationship with a primary care physician if you do not have one) for your aftercare needs so that they can reassess your need for medications and monitor your lab values.    On the day of Discharge:   Pt doing well. No pain. No drainage from I and d site VITAL SIGNS:   Blood pressure 117/79, pulse 60, temperature 97.6 F (36.4 C), temperature source Oral, resp. rate 18, height 5\' 5"  (1.651 m), weight 73.9 kg (163 lb), SpO2 100 %.  PHYSICAL EXAMINATION:   GENERAL:  75 y.o.-year-old patient lying in the bed with no acute distress.  EYES: Pupils equal, round, reactive to light and accommodation. No scleral icterus. Extraocular muscles intact.  HEENT: Head atraumatic, normocephalic. Left jaw swelling, s/p drainage Poor oral dentition, infected teeth left lowe mandible. Otherwise Oropharynx and nasopharynx clear.  NECK:  Supple, no jugular venous distention. No thyroid enlargement, no tenderness.  LUNGS: Normal breath sounds bilaterally, no wheezing, rales,rhonchi or crepitation. No use of accessory muscles of respiration. Decreased bibasilar breath sounds. CARDIOVASCULAR: S1, S2 normal. No murmurs, rubs, or gallops. Pacemaker in place. ABDOMEN: Soft, nontender, nondistended. Bowel sounds present. No organomegaly or mass.  EXTREMITIES: No pedal edema, cyanosis, or clubbing.  NEUROLOGIC: Cranial nerves II through XII are intact. Muscle strength 5/5 in all extremities. Sensation intact. Gait not checked. Global weakness noted PSYCHIATRIC: The patient is alert and oriented x 3.  SKIN: No obvious rash, lesion, or ulcer.     DATA REVIEW:   CBC  Recent Labs Lab 04/11/16 0609  WBC 7.7  HGB 14.3  HCT 40.8  PLT 261    Chemistries   Recent Labs Lab 04/12/16 0343  NA 140  K 3.4*  CL 105  CO2 25  GLUCOSE 106*  BUN 14  CREATININE 0.54  CALCIUM 9.2      Microbiology Results  Results for orders placed or performed during the hospital encounter of 04/10/16  Aerobic/Anaerobic Culture (surgical/deep wound)     Status: None (Preliminary result)   Collection Time: 04/10/16  8:05 PM  Result Value Ref Range Status   Specimen Description ABSCESS LEFT NECK MANDIBLE  Final   Special Requests NONE  Final   Gram Stain   Final    ABUNDANT WBC PRESENT, PREDOMINANTLY PMN MODERATE GRAM POSITIVE COCCI IN PAIRS ABUNDANT GRAM VARIABLE ROD Performed at [facility]    Culture PENDING  Incomplete   Report Status PENDING  Incomplete    RADIOLOGY:  No results found.   Management plans discussed with the patient, family and they are in agreement.  CODE STATUS:     Code Status Orders        Start     Ordered   04/10/16 2054  Do not attempt resuscitation (DNR)  Continuous    Question Answer Comment  In the event of cardiac or respiratory ARREST Do not call a "code blue"   In the event of cardiac or respiratory ARREST Do not  perform Intubation, CPR, defibrillation or ACLS   In the event of cardiac or respiratory ARREST Use medication by any route, position, wound care, and other measures to relive pain and suffering. May use oxygen, suction and manual treatment of airway obstruction as needed for comfort.      04/10/16 2054    Code Status History    Date Active Date Inactive Code Status Order ID Comments User Context   03/19/2016  7:57 PM 03/23/2016  5:25 PM Full Code [id number]  Shaune Pollack, MD Inpatient      TOTAL TIME TAKING CARE OF THIS PATIENT: 40  minutes.    Brockton Mckesson M.D on 04/12/2016 at 8:02 AM  Between 7am to 6pm - Pager - [phone]  After 6pm go to [email or web address] - Scientist, research (life sciences) [facility]  Office  [phone]  CC: Primary care physician; No PCP Per Patient   Note: This dictation was prepared with Dragon dictation along with smaller phrase technology. Any transcriptional errors  that result from this process are unintentional.

## 2016-04-12 NOTE — Clinical Social Work Note (Signed)
CSW delivered packet to chart for d/c. [facility] aware of return set for today. CSW attempted to contact patient supports; Patient's son did not answer and had no voice mail X2, and patient's friend did not answer. CSW left HIPAA compliant voicemail to call this Sw. Pt d/c with no additional needs.  Bryn Gulling [place] MSW, LCSW-A [phone]

## 2016-04-12 NOTE — Progress Notes (Signed)
Alert and oriented. Pain control with oral pain medication. Able to sleep during the night.

## 2016-04-12 NOTE — Progress Notes (Signed)
Called report to [place] at [facility]. IV removed, belongings packed. EMS called.

## 2016-04-16 LAB — AEROBIC/ANAEROBIC CULTURE W GRAM STAIN (SURGICAL/DEEP WOUND)

## 2016-04-16 LAB — AEROBIC/ANAEROBIC CULTURE (SURGICAL/DEEP WOUND)

## 2016-07-08 ENCOUNTER — Other Ambulatory Visit: Payer: Self-pay | Admitting: Internal Medicine

## 2016-07-08 DIAGNOSIS — Z1231 Encounter for screening mammogram for malignant neoplasm of breast: Secondary | ICD-10-CM

## 2016-08-14 ENCOUNTER — Ambulatory Visit: Payer: Medicare Other | Attending: Internal Medicine

## 2018-04-17 ENCOUNTER — Emergency Department
Admission: EM | Admit: 2018-04-17 | Discharge: 2018-04-17 | Disposition: A | Discharge status: Home / Self Care | Payer: Medicare Other | Attending: Emergency Medicine | Admitting: Emergency Medicine

## 2018-04-17 ENCOUNTER — Emergency Department: Payer: Medicare Other

## 2018-04-17 ENCOUNTER — Encounter: Payer: Self-pay | Admitting: Emergency Medicine

## 2018-04-17 DIAGNOSIS — Z950 Presence of cardiac pacemaker: Secondary | ICD-10-CM | POA: Insufficient documentation

## 2018-04-17 DIAGNOSIS — Z8673 Personal history of transient ischemic attack (TIA), and cerebral infarction without residual deficits: Secondary | ICD-10-CM | POA: Insufficient documentation

## 2018-04-17 DIAGNOSIS — K8050 Calculus of bile duct without cholangitis or cholecystitis without obstruction: Secondary | ICD-10-CM | POA: Diagnosis not present

## 2018-04-17 DIAGNOSIS — Z79899 Other long term (current) drug therapy: Secondary | ICD-10-CM | POA: Diagnosis not present

## 2018-04-17 DIAGNOSIS — R112 Nausea with vomiting, unspecified: Secondary | ICD-10-CM | POA: Diagnosis not present

## 2018-04-17 DIAGNOSIS — R4182 Altered mental status, unspecified: Secondary | ICD-10-CM | POA: Diagnosis not present

## 2018-04-17 DIAGNOSIS — F17210 Nicotine dependence, cigarettes, uncomplicated: Secondary | ICD-10-CM | POA: Insufficient documentation

## 2018-04-17 HISTORY — DX: Cerebral infarction, unspecified: I63.9

## 2018-04-17 LAB — COMPREHENSIVE METABOLIC PANEL
ALK PHOS: 84 U/L (ref 38–126)
ALT: 9 U/L (ref 0–44)
AST: 23 U/L (ref 15–41)
Albumin: 4.1 g/dL (ref 3.5–5.0)
Anion gap: 9 (ref 5–15)
BUN: 15 mg/dL (ref 8–23)
CALCIUM: 9.3 mg/dL (ref 8.9–10.3)
CHLORIDE: 105 mmol/L (ref 98–111)
CO2: 23 mmol/L (ref 22–32)
CREATININE: 0.72 mg/dL (ref 0.44–1.00)
GFR calc Af Amer: >60 mL/min (ref >60)
Glucose, Bld: 148 mg/dL — ABNORMAL HIGH (ref 70–99)
Potassium: 4 mmol/L (ref 3.5–5.1)
Sodium: 137 mmol/L (ref 135–145)
Total Bilirubin: 0.9 mg/dL (ref 0.3–1.2)
Total Protein: 7.5 g/dL (ref 6.5–8.1)

## 2018-04-17 LAB — CBC WITH DIFFERENTIAL/PLATELET
Basophils Absolute: 0.1 10*3/uL (ref 0–0.1)
Basophils Relative: 0 %
EOS PCT: 1 %
Eosinophils Absolute: 0.1 10*3/uL (ref 0–0.7)
HEMATOCRIT: 43.8 % (ref 35.0–47.0)
Hemoglobin: 14.9 g/dL (ref 12.0–16.0)
LYMPHS PCT: 11 %
Lymphs Abs: 1.7 10*3/uL (ref 1.0–3.6)
MCH: 29.3 pg (ref 26.0–34.0)
MCHC: 34 g/dL (ref 32.0–36.0)
MCV: 86.2 fL (ref 80.0–100.0)
MONO ABS: 0.9 10*3/uL (ref 0.2–0.9)
MONOS PCT: 6 %
NEUTROS ABS: 12.8 10*3/uL — AB (ref 1.4–6.5)
Neutrophils Relative %: 82 %
Platelets: 227 10*3/uL (ref 150–440)
RBC: 5.08 MIL/uL (ref 3.80–5.20)
RDW: 14.4 % (ref 11.5–14.5)
WBC: 15.6 10*3/uL — ABNORMAL HIGH (ref 3.6–11.0)

## 2018-04-17 LAB — URINALYSIS, COMPLETE (UACMP) WITH MICROSCOPIC
Bacteria, UA: NONE SEEN
Bilirubin Urine: NEGATIVE
GLUCOSE, UA: NEGATIVE mg/dL
HGB URINE DIPSTICK: NEGATIVE
Ketones, ur: NEGATIVE mg/dL
Leukocytes, UA: NEGATIVE
Nitrite: NEGATIVE
Protein, ur: NEGATIVE mg/dL
pH: 6 (ref 5.0–8.0)

## 2018-04-17 LAB — TROPONIN I: Troponin I: <0.03 ng/mL (ref <0.03)

## 2018-04-17 LAB — LIPASE, BLOOD: Lipase: 25 U/L (ref 11–51)

## 2018-04-17 MED ORDER — IOHEXOL 300 MG/ML  SOLN
100.0000 mL | Freq: Once | INTRAMUSCULAR | Status: AC | PRN
  Administered 2018-04-17: 100 mL via INTRAVENOUS

## 2018-04-17 MED ORDER — ONDANSETRON HCL 4 MG/2ML IJ SOLN
4.0000 mg | Freq: Once | INTRAMUSCULAR | Status: AC
  Administered 2018-04-17: 4 mg via INTRAVENOUS
  Filled 2018-04-17: qty 2

## 2018-04-17 MED ORDER — GI COCKTAIL ~~LOC~~
30.0000 mL | Freq: Once | ORAL | Status: AC
  Administered 2018-04-17: 30 mL via ORAL
  Filled 2018-04-17: qty 30

## 2018-04-17 MED ORDER — ONDANSETRON 4 MG PO TBDP: 4.0000 mg | ORAL_TABLET | Freq: Three times a day (TID) | ORAL | 0 refills | Status: DC | PRN

## 2018-04-17 NOTE — ED Triage Notes (Signed)
Patient brought in by ems from home with complaint of vomiting times 30 minutes. Patient denies abdominal pain.

## 2018-04-17 NOTE — ED Notes (Signed)
Removed 20 gauge IV from right hand upon DC.

## 2018-04-17 NOTE — Discharge Instructions (Signed)
These make sure you remain well-hydrated and make an appointment to follow-up with the general surgeon as needed to evaluate your gallbladder further.  Return to the emergency department sooner for any issues whatsoever.  It was a pleasure to take care of you today, and thank you for coming to our emergency department.  If you have any questions or concerns before leaving please ask the nurse to grab me and I'm more than happy to go through your aftercare instructions again.  If you were prescribed any opioid pain medication today such as Norco, Vicodin, Percocet, morphine, hydrocodone, or oxycodone please make sure you do not drive when you are taking this medication as it can alter your ability to drive safely.  If you have any concerns once you are home that you are not improving or are in fact getting worse before you can make it to your follow-up appointment, please do not hesitate to call [phone] and come back for further evaluation.  Merrily BrittleNeil Ryver Poblete, MD  Results for orders placed or performed during the hospital encounter of 04/17/18  Comprehensive metabolic panel  Result Value Ref Range   Sodium 137 135 - 145 mmol/L   Potassium 4.0 3.5 - 5.1 mmol/L   Chloride 105 98 - 111 mmol/L   CO2 23 22 - 32 mmol/L   Glucose, Bld 148 (H) 70 - 99 mg/dL   BUN 15 8 - 23 mg/dL   Creatinine, Ser 1.610.72 0.44 - 1.00 mg/dL   Calcium 9.3 8.9 - 09.610.3 mg/dL   Total Protein 7.5 6.5 - 8.1 g/dL   Albumin 4.1 3.5 - 5.0 g/dL   AST 23 15 - 41 U/L   ALT 9 0 - 44 U/L   Alkaline Phosphatase 84 38 - 126 U/L   Total Bilirubin 0.9 0.3 - 1.2 mg/dL   GFR calc non Af Amer >60 >60 mL/min   GFR calc Af Amer >60 >60 mL/min   Anion gap 9 5 - 15  Lipase, blood  Result Value Ref Range   Lipase 25 11 - 51 U/L  Troponin I  Result Value Ref Range   Troponin I <0.03 <0.03 ng/mL  CBC with Differential  Result Value Ref Range   WBC 15.6 (H) 3.6 - 11.0 K/uL   RBC 5.08 3.80 - 5.20 MIL/uL   Hemoglobin 14.9 12.0 - 16.0 g/dL   HCT  04.543.8 40.935.0 - 81.147.0 %   MCV 86.2 80.0 - 100.0 fL   MCH 29.3 26.0 - 34.0 pg   MCHC 34.0 32.0 - 36.0 g/dL   RDW 91.414.4 78.211.5 - 95.614.5 %   Platelets 227 150 - 440 K/uL   Neutrophils Relative % 82 %   Neutro Abs 12.8 (H) 1.4 - 6.5 K/uL   Lymphocytes Relative 11 %   Lymphs Abs 1.7 1.0 - 3.6 K/uL   Monocytes Relative 6 %   Monocytes Absolute 0.9 0.2 - 0.9 K/uL   Eosinophils Relative 1 %   Eosinophils Absolute 0.1 0 - 0.7 K/uL   Basophils Relative 0 %   Basophils Absolute 0.1 0 - 0.1 K/uL   Ct Head Wo Contrast  Result Date: 04/17/2018 CLINICAL DATA:  Altered level of consciousness with nausea and vomiting. EXAM: CT HEAD WITHOUT CONTRAST TECHNIQUE: Contiguous axial images were obtained from the base of the skull through the vertex without intravenous contrast. COMPARISON:  [facility] dating back to MRI from 07/22/2011 FINDINGS: Brain: Chronic stable sulcal and ventricular prominence consistent with cerebral atrophy is redemonstrated. Moderate chronic small vessel  ischemia is noted of the periventricular subcortical white matter. No acute intracranial hemorrhage, midline shift or edema. No large vascular territory infarct. No intra-axial mass nor extra-axial fluid collections. Midline fourth ventricle and basal cisterns. Brainstem and cerebellum are without acute abnormalities. Vascular: Never dense vessel sign. Skull: Intact Sinuses/Orbits: No acute paranasal sinus, mastoid nor ocular abnormality. Other: None IMPRESSION: Chronic atrophy with ventriculomegaly and sulcal prominence. Chronic small vessel ischemic disease. No acute intracranial abnormality. Electronically Signed   By: Tollie Eth M.D.   On: 04/17/2018 02:09   Ct Abdomen Pelvis W Contrast  Result Date: 04/17/2018 CLINICAL DATA:  Acute onset of nausea and vomiting. Generalized abdominal pain. Altered level of consciousness. Leukocytosis. EXAM: CT ABDOMEN AND PELVIS WITH CONTRAST TECHNIQUE: Multidetector CT imaging of the abdomen and pelvis was  performed using the standard protocol following bolus administration of intravenous contrast. CONTRAST:  OMNIPAQUE IOHEXOL 300 MG/ML  SOLN COMPARISON:  Pelvic radiograph performed 03/19/2016 FINDINGS: Lower chest: The visualized lung bases are grossly clear. The visualized portions of the mediastinum are unremarkable. Pacemaker leads are partially imaged. Hepatobiliary: The liver is unremarkable in appearance. Tiny stones are seen dependently within the gallbladder. The gallbladder is otherwise unremarkable. The common bile duct remains normal in caliber. Pancreas: The pancreas is within normal limits. Spleen: The spleen is unremarkable in appearance. Adrenals/Urinary Tract: The adrenal glands are unremarkable in appearance. The kidneys are within normal limits. There is no evidence of hydronephrosis. No renal or ureteral stones are identified. No perinephric stranding is seen. Stomach/Bowel: The stomach is unremarkable in appearance. The small bowel is within normal limits. The appendix is not visualized; there is no evidence for appendicitis. Diffuse diverticulosis is noted along the descending and sigmoid colon, without evidence of diverticulitis. Vascular/Lymphatic: Scattered calcification is seen along the abdominal aorta and its branches. The abdominal aorta is otherwise grossly unremarkable. The inferior vena cava is grossly unremarkable. No retroperitoneal lymphadenopathy is seen. No pelvic sidewall lymphadenopathy is identified. Reproductive: The bladder is mildly distended and grossly unremarkable. The patient is status post hysterectomy. No suspicious adnexal masses are seen. Postoperative change is noted about the upper pelvis. Other: No additional soft tissue abnormalities are seen. Musculoskeletal: No acute osseous abnormalities are identified. Vacuum phenomenon is noted at the lower lumbar spine. The visualized musculature is unremarkable in appearance. IMPRESSION: 1. No acute abnormality seen  to explain the patient's symptoms. 2. Diffuse diverticulosis along the descending and sigmoid colon, without evidence of diverticulitis. 3. Cholelithiasis; gallbladder otherwise unremarkable in appearance. Aortic Atherosclerosis (ICD10-I70.0). Electronically Signed   By: Roanna Raider M.D.   On: 04/17/2018 02:12

## 2018-04-17 NOTE — ED Provider Notes (Signed)
[facility] Emergency Department Provider Note  ____________________________________________   First MD Initiated Contact with Patient 04/17/18 0025     (approximate)  I have reviewed the triage vital signs and the nursing notes.   HISTORY  Chief Complaint Emesis   HPI Donna Dodson is a 77 y.o. female who comes to the emergency department via EMS with abdominal pain nausea and vomiting for 30 minutes.  Her symptoms have completely resolved.  They came on suddenly while she was sitting down after eating a greasy meal.  They have subsequently resolved.  The pain was not ripping or tearing and did not go straight to her back.  It was epigastric and on the right side.  She is never had this before.  Eating seemed to make it worse nothing seemed to make it better.    Past Medical History:  Diagnosis Date  . Depressed   . Pacemaker   . Stroke [facility](HCC)     Patient Active Problem List   Diagnosis Date Noted  . 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 Rhabdomyolysis 03/19/2016    No past surgical history on file.  Prior to Admission medications   Medication Sig Start Date End Date Taking? Authorizing Provider  acetaminophen (TYLENOL) 325 MG tablet Take 2 tablets (650 mg total) by mouth every 6 (six) hours as needed for mild pain, fever or headache (or Fever >/= 101). 04/11/16   Enid BaasKalisetti, Radhika, MD  amoxicillin-clavulanate (AUGMENTIN) 875-125 MG tablet Take 1 tablet by mouth every 12 (twelve) hours. 04/12/16   Enedina FinnerPatel, Sona, MD  feeding supplement, ENSURE ENLIVE, (ENSURE ENLIVE) LIQD Take 237 mLs by mouth 2 (two) times daily between meals. 04/11/16   Enid BaasKalisetti, Radhika, MD  ibuprofen (ADVIL,MOTRIN) 400 MG tablet Take 1 tablet (400 mg total) by mouth every 6 (six) hours as needed for moderate pain. 04/11/16   Enid BaasKalisetti, Radhika, MD  ondansetron (ZOFRAN ODT) 4 MG disintegrating tablet Take 1 tablet (4 mg total) by mouth every 8 (eight) hours as needed for nausea or vomiting.  04/17/18   Merrily Brittleifenbark, Mei Suits, MD  oxybutynin (DITROPAN-XL) 10 MG 24 hr tablet Take 1 tablet (10 mg total) by mouth daily. 04/11/16   Enid BaasKalisetti, Radhika, MD    Allergies Sulfa antibiotics  Family History  Family history unknown: Yes    Social History Social History   Tobacco Use  . Smoking status: Current Every Day Smoker    Packs/day: 0.50  . Smokeless tobacco: Never Used  Substance Use Topics  . Alcohol use: No  . Drug use: Not on file    Review of Systems Constitutional: No fever/chills Eyes: No visual changes. ENT: No sore throat. Cardiovascular: Denies chest pain. Respiratory: Denies shortness of breath. Gastrointestinal: Positive for abdominal pain.  Positive for nausea, positive for vomiting.  No diarrhea.  No constipation. Genitourinary: Negative for dysuria. Musculoskeletal: Negative for back pain. Skin: Negative for rash. Neurological: Negative for headaches, focal weakness or numbness.   ____________________________________________   PHYSICAL EXAM:  VITAL SIGNS: ED Triage Vitals  Enc Vitals Group     BP      Pulse      Resp      Temp      Temp src      SpO2      Weight      Height      Head Circumference      Peak Flow      Pain Score      Pain Loc  Pain Edu?      Excl. in GC?     Constitutional: Alert and oriented x4 pleasant cooperative speaks in full clear sentences Eyes: PERRL EOMI. Head: Atraumatic. Nose: No congestion/rhinnorhea. Mouth/Throat: No trismus Neck: No stridor.   Cardiovascular: Normal rate, regular rhythm. Grossly normal heart sounds.  Good peripheral circulation. Respiratory: Normal respiratory effort.  No retractions. Lungs CTAB and moving good air Gastrointestinal: Soft mild epigastric tenderness no rebound or guarding no peritonitis Musculoskeletal: No lower extremity edema   Neurologic:  Normal speech and language. No gross focal neurologic deficits are appreciated. Skin:  Skin is warm, dry and intact. No rash  noted. Psychiatric: Mood and affect are normal. Speech and behavior are normal.    ____________________________________________   DIFFERENTIAL includes but not limited to  Biliary colic, cholecystitis, dehydration, food poisoning ____________________________________________   LABS (all labs ordered are listed, but only abnormal results are displayed)  Labs Reviewed  URINALYSIS, COMPLETE (UACMP) WITH MICROSCOPIC - Abnormal; Notable for the following components:      Result Value   Color, Urine STRAW (*)    APPearance CLEAR (*)    Specific Gravity, Urine >1.046 (*)    All other components within normal limits  COMPREHENSIVE METABOLIC PANEL - Abnormal; Notable for the following components:   Glucose, Bld 148 (*)    All other components within normal limits  CBC WITH DIFFERENTIAL/PLATELET - Abnormal; Notable for the following components:   WBC 15.6 (*)    Neutro Abs 12.8 (*)    All other components within normal limits  LIPASE, BLOOD  TROPONIN I    Lab work reviewed by me shows elevated specific gravity consistent with dehydration.  Elevated white count nonspecific and could be secondary to pain or infection __________________________________________  EKG  ED ECG REPORT I, Merrily Brittle, the attending physician, personally viewed and interpreted this ECG.  Date: 04/18/2018 EKG Time:  Rate: 89 Rhythm: normal sinus rhythm QRS Axis: normal Intervals: normal ST/T Wave abnormalities: normal Narrative Interpretation: no evidence of acute ischemia  ____________________________________________  RADIOLOGY  CT abdomen pelvis reviewed by me shows gallstones but no other etiology of her symptoms Head CT reviewed by me with no acute disease ____________________________________________   PROCEDURES  Procedure(s) performed: no  Procedures  Critical Care performed: no  ____________________________________________   INITIAL IMPRESSION / ASSESSMENT AND PLAN / ED  COURSE  Pertinent labs & imaging results that were available during my care of the patient were reviewed by me and considered in my medical decision making (see chart for details).   As part of my medical decision making, I reviewed the following data within the electronic MEDICAL RECORD NUMBER History obtained from family if available, nursing notes, old chart and ekg, as well as notes from prior ED visits.  Patient arrives with abdominal pain nausea and vomiting that is subsequently resolved.  Given a GI cocktail and Zofran and she has had no further events.  CT scan is reassuring and her lab work shows mostly dehydration which is improved after fluids.  Discharged home with surgery follow-up for gallstones.      ____________________________________________   FINAL CLINICAL IMPRESSION(S) / ED DIAGNOSES  Final diagnoses:  Non-intractable vomiting with nausea, unspecified vomiting type  Biliary colic      NEW MEDICATIONS STARTED DURING THIS VISIT:  Discharge Medication List as of 04/17/2018  2:45 AM    START taking these medications   Details  ondansetron (ZOFRAN ODT) 4 MG disintegrating tablet Take 1 tablet (4 mg total)  by mouth every 8 (eight) hours as needed for nausea or vomiting., Starting Sat 04/17/2018, Print         Note:  This document was prepared using Dragon voice recognition software and may include unintentional dictation errors.     Merrily Brittle, MD 04/18/18 [phone]

## 2020-07-02 ENCOUNTER — Emergency Department: Payer: Medicare Other

## 2020-07-02 ENCOUNTER — Inpatient Hospital Stay
Admission: EM | Admit: 2020-07-02 | Discharge: 2020-07-04 | DRG: 558 | Disposition: A | Discharge status: Skilled Nursing Facility | Payer: Medicare Other | Attending: Internal Medicine | Admitting: Internal Medicine

## 2020-07-02 ENCOUNTER — Other Ambulatory Visit: Payer: Self-pay

## 2020-07-02 ENCOUNTER — Encounter: Payer: Self-pay | Admitting: Internal Medicine

## 2020-07-02 DIAGNOSIS — W19XXXA Unspecified fall, initial encounter: Secondary | ICD-10-CM

## 2020-07-02 DIAGNOSIS — Z720 Tobacco use: Secondary | ICD-10-CM | POA: Diagnosis present

## 2020-07-02 DIAGNOSIS — M6282 Rhabdomyolysis: Principal | ICD-10-CM | POA: Diagnosis present

## 2020-07-02 DIAGNOSIS — Y92009 Unspecified place in unspecified non-institutional (private) residence as the place of occurrence of the external cause: Secondary | ICD-10-CM

## 2020-07-02 DIAGNOSIS — D72829 Elevated white blood cell count, unspecified: Secondary | ICD-10-CM | POA: Diagnosis present

## 2020-07-02 DIAGNOSIS — I639 Cerebral infarction, unspecified: Secondary | ICD-10-CM | POA: Diagnosis present

## 2020-07-02 DIAGNOSIS — Z950 Presence of cardiac pacemaker: Secondary | ICD-10-CM

## 2020-07-02 DIAGNOSIS — E860 Dehydration: Secondary | ICD-10-CM | POA: Diagnosis present

## 2020-07-02 DIAGNOSIS — Z8673 Personal history of transient ischemic attack (TIA), and cerebral infarction without residual deficits: Secondary | ICD-10-CM

## 2020-07-02 DIAGNOSIS — W06XXXA Fall from bed, initial encounter: Secondary | ICD-10-CM | POA: Diagnosis present

## 2020-07-02 DIAGNOSIS — Y92013 Bedroom of single-family (private) house as the place of occurrence of the external cause: Secondary | ICD-10-CM

## 2020-07-02 DIAGNOSIS — E872 Acidosis, unspecified: Secondary | ICD-10-CM | POA: Diagnosis present

## 2020-07-02 DIAGNOSIS — F17210 Nicotine dependence, cigarettes, uncomplicated: Secondary | ICD-10-CM | POA: Diagnosis present

## 2020-07-02 DIAGNOSIS — Z882 Allergy status to sulfonamides status: Secondary | ICD-10-CM

## 2020-07-02 DIAGNOSIS — Z20822 Contact with and (suspected) exposure to covid-19: Secondary | ICD-10-CM | POA: Diagnosis present

## 2020-07-02 LAB — TROPONIN I (HIGH SENSITIVITY): Troponin I (High Sensitivity): 12 ng/L (ref <18)

## 2020-07-02 LAB — COMPREHENSIVE METABOLIC PANEL
ALT: 18 U/L (ref 0–44)
AST: 40 U/L (ref 15–41)
Albumin: 4.6 g/dL (ref 3.5–5.0)
Alkaline Phosphatase: 85 U/L (ref 38–126)
Anion gap: 18 — ABNORMAL HIGH (ref 5–15)
BUN: 41 mg/dL — ABNORMAL HIGH (ref 8–23)
CO2: 16 mmol/L — ABNORMAL LOW (ref 22–32)
Calcium: 10.1 mg/dL (ref 8.9–10.3)
Chloride: 104 mmol/L (ref 98–111)
Creatinine, Ser: 1 mg/dL (ref 0.44–1.00)
GFR, Estimated: 58 mL/min — ABNORMAL LOW (ref >60)
Glucose, Bld: 143 mg/dL — ABNORMAL HIGH (ref 70–99)
Potassium: 4.1 mmol/L (ref 3.5–5.1)
Sodium: 138 mmol/L (ref 135–145)
Total Bilirubin: 1.5 mg/dL — ABNORMAL HIGH (ref 0.3–1.2)
Total Protein: 8.8 g/dL — ABNORMAL HIGH (ref 6.5–8.1)

## 2020-07-02 LAB — LACTIC ACID, PLASMA
Lactic Acid, Venous: 2.7 mmol/L (ref 0.5–1.9)
Lactic Acid, Venous: 1.3 mmol/L (ref 0.5–1.9)

## 2020-07-02 LAB — URINALYSIS, COMPLETE (UACMP) WITH MICROSCOPIC
Bilirubin Urine: NEGATIVE
Glucose, UA: NEGATIVE mg/dL
Hgb urine dipstick: NEGATIVE
Ketones, ur: 80 mg/dL — AB
Leukocytes,Ua: NEGATIVE
Nitrite: NEGATIVE
Protein, ur: 30 mg/dL — AB
Specific Gravity, Urine: 1.024 (ref 1.005–1.030)
pH: 6 (ref 5.0–8.0)

## 2020-07-02 LAB — CBC WITH DIFFERENTIAL/PLATELET
Abs Immature Granulocytes: 0.08 10*3/uL — ABNORMAL HIGH (ref 0.00–0.07)
Basophils Absolute: 0.1 10*3/uL (ref 0.0–0.1)
Basophils Relative: 0 %
Eosinophils Absolute: 0 10*3/uL (ref 0.0–0.5)
Eosinophils Relative: 0 %
HCT: 49.5 % — ABNORMAL HIGH (ref 36.0–46.0)
Hemoglobin: 16.7 g/dL — ABNORMAL HIGH (ref 12.0–15.0)
Immature Granulocytes: 1 %
Lymphocytes Relative: 6 %
Lymphs Abs: 1.1 10*3/uL (ref 0.7–4.0)
MCH: 29.5 pg (ref 26.0–34.0)
MCHC: 33.7 g/dL (ref 30.0–36.0)
MCV: 87.3 fL (ref 80.0–100.0)
Monocytes Absolute: 0.8 10*3/uL (ref 0.1–1.0)
Monocytes Relative: 5 %
Neutro Abs: 14.7 10*3/uL — ABNORMAL HIGH (ref 1.7–7.7)
Neutrophils Relative %: 88 %
Platelets: 326 10*3/uL (ref 150–400)
RBC: 5.67 MIL/uL — ABNORMAL HIGH (ref 3.87–5.11)
RDW: 13.7 % (ref 11.5–15.5)
WBC: 16.8 10*3/uL — ABNORMAL HIGH (ref 4.0–10.5)
nRBC: 0 % (ref 0.0–0.2)

## 2020-07-02 LAB — RESPIRATORY PANEL BY RT PCR (FLU A&B, COVID)
Influenza A by PCR: NEGATIVE
Influenza B by PCR: NEGATIVE
SARS Coronavirus 2 by RT PCR: NEGATIVE

## 2020-07-02 LAB — CK: Total CK: 1032 U/L — ABNORMAL HIGH (ref 38–234)

## 2020-07-02 LAB — BRAIN NATRIURETIC PEPTIDE: B Natriuretic Peptide: 79.7 pg/mL (ref 0.0–100.0)

## 2020-07-02 MED ORDER — SODIUM CHLORIDE 0.9 % IV SOLN
INTRAVENOUS | Status: DC
Start: 2020-07-02 — End: 2020-07-04

## 2020-07-02 MED ORDER — ENOXAPARIN SODIUM 40 MG/0.4ML ~~LOC~~ SOLN
40.0000 mg | SUBCUTANEOUS | Status: DC
  Administered 2020-07-02 – 2020-07-03 (×2): 40 mg via SUBCUTANEOUS
  Filled 2020-07-02: qty 0.4

## 2020-07-02 MED ORDER — ONDANSETRON HCL 4 MG/2ML IJ SOLN: 4.0000 mg | Freq: Three times a day (TID) | INTRAMUSCULAR | Status: DC | PRN

## 2020-07-02 MED ORDER — SODIUM CHLORIDE 0.9 % IV BOLUS
1000.0000 mL | Freq: Once | INTRAVENOUS | Status: AC
  Administered 2020-07-02: 1000 mL via INTRAVENOUS

## 2020-07-02 MED ORDER — SODIUM CHLORIDE 0.9 % IV BOLUS: 500.0000 mL | Freq: Once | INTRAVENOUS | Status: DC

## 2020-07-02 MED ORDER — NICOTINE 21 MG/24HR TD PT24
21.0000 mg | MEDICATED_PATCH | Freq: Every day | TRANSDERMAL | Status: DC
  Administered 2020-07-04: 21 mg via TRANSDERMAL
  Filled 2020-07-02 (×2): qty 1

## 2020-07-02 MED ORDER — ACETAMINOPHEN 325 MG PO TABS: 650.0000 mg | ORAL_TABLET | Freq: Four times a day (QID) | ORAL | Status: DC | PRN

## 2020-07-02 NOTE — Progress Notes (Signed)
Patient arrived to unit

## 2020-07-02 NOTE — ED Triage Notes (Signed)
Per EMS, pt was found on floor by husband. Patient down for appox 3-5 days. Pt denies fall, reports "rolling out of bed." C/o right shoulder pain

## 2020-07-02 NOTE — ED Notes (Addendum)
Date and time results received: 07/02/20 12:44 PM  Test: Lactic Critical Value: 2.7  Name of Provider Notified: Siadecki

## 2020-07-02 NOTE — H&P (Signed)
History and Physical    Donna Dodson KXF:[id number] DOB: 07-Mar-1941 DOA: 07/02/2020  Referring MD/NP/PA:   PCP: Patient, No Pcp Per   Patient coming from:  The patient is coming from home.  At baseline, pt is partially dependent for most of ADL.        Chief Complaint: fall  HPI: Donna Dodson is a 79 y.o. female with medical history significant of stroke, depression, pacemaker placement, rhabdomyolysis, tobacco abuse, who presents with fall.  Patient states that rolled over the bed and fell on the floor at least 3 days ago. She states that she felt too weak to get up.  She was found on the floor by her son today.The patient reports mild pain to the right shoulder.  No unilateral numbness or tingling extremities.  No facial droop or slurred speech.  Patient denies chest pain, shortness breath, cough, fever or chills.  No nausea vomiting, diarrhea, abdominal pain, symptoms of UTI.  ED Course: pt was found to have WBC 16.8, lactic acid 2.7, troponin 12, BNP 79, pending urinalysis, pending COVID-19 PCR, CK 1022, renal function okay, temperature normal, blood pressure 128/99, heart rate 74, RR 18, oxygen saturation 98% on room air.  Chest x-ray is negative for infiltration, but showed questionable right upper lobe nodule.  CT of the head is negative for acute intracranial abnormalities.  CT of C-spine is negative for acute issues, but showed degenerative disc disease.  X-ray of right shoulder is negative for bony fracture.  Patient is placed on MedSurg bed for observation.   Review of Systems:   General: no fevers, chills, no body weight gain, has fatigue HEENT: no blurry vision, hearing changes or sore throat Respiratory: no dyspnea, coughing, wheezing CV: no chest pain, no palpitations GI: no nausea, vomiting, abdominal pain, diarrhea, constipation GU: no dysuria, burning on urination, increased urinary frequency, hematuria  Ext: no leg edema Neuro: no unilateral weakness, numbness, or  tingling, no vision change or hearing loss. has fall Skin: no rash, no skin tear. MSK: No muscle spasm, no deformity, no limitation of range of movement in spin. Has right shoulder pain. Heme: No easy bruising.  Travel history: No recent long distant travel.  Allergy:  Allergies  Allergen Reactions  . Sulfa Antibiotics Rash    Past Medical History:  Diagnosis Date  . Depressed   . Pacemaker   . Stroke [facility])     Past Surgical History:  Procedure Laterality Date  . PACEMAKER PLACEMENT      Social History:  reports that she has been smoking. She has been smoking about 0.50 packs per day. She has never used smokeless tobacco. She reports that she does not drink alcohol and does not use drugs.  Family History: Reviewed with patient and patient does not know detailed information about her family medical history.  Family History  Family history unknown: Yes     Prior to Admission medications   Medication Sig Start Date End Date Taking? Authorizing Provider  acetaminophen (TYLENOL) 325 MG tablet Take 2 tablets (650 mg total) by mouth every 6 (six) hours as needed for mild pain, fever or headache (or Fever >/= 101). 04/11/16   Enid Baas, MD  amoxicillin-clavulanate (AUGMENTIN) 875-125 MG tablet Take 1 tablet by mouth every 12 (twelve) hours. 04/12/16   Enedina Finner, MD  feeding supplement, ENSURE ENLIVE, (ENSURE ENLIVE) LIQD Take 237 mLs by mouth 2 (two) times daily between meals. 04/11/16   Enid Baas, MD  ibuprofen (ADVIL,MOTRIN) 400 MG  tablet Take 1 tablet (400 mg total) by mouth every 6 (six) hours as needed for moderate pain. 04/11/16   Enid BaasKalisetti, Radhika, MD  ondansetron (ZOFRAN ODT) 4 MG disintegrating tablet Take 1 tablet (4 mg total) by mouth every 8 (eight) hours as needed for nausea or vomiting. 04/17/18   Merrily Brittleifenbark, Neil, MD  oxybutynin (DITROPAN-XL) 10 MG 24 hr tablet Take 1 tablet (10 mg total) by mouth daily. 04/11/16   Enid BaasKalisetti, Radhika, MD    Physical  Exam: Vitals:   07/02/20 1140  BP: (!) 128/99  Pulse: 74  Resp: 18  Temp: 97.6 F (36.4 C)  TempSrc: Oral  SpO2: 98%  Weight: 78 kg  Height: 5\' 6"  (1.676 m)   General: Not in acute distress HEENT:       Eyes: PERRL, EOMI, no scleral icterus.       ENT: No discharge from the ears and nose, no pharynx injection, no tonsillar enlargement.        Neck: No JVD, no bruit, no mass felt. Heme: No neck lymph node enlargement. Cardiac: S1/S2, RRR, No murmurs, No gallops or rubs. Respiratory: No rales, wheezing, rhonchi or rubs. GI: Soft, nondistended, nontender, no rebound pain, no organomegaly, BS present. GU: No hematuria Ext: No pitting leg edema bilaterally. 2+DP/PT pulse bilaterally. Musculoskeletal: No joint deformities, No joint redness or warmth, no limitation of ROM in spin. Has mild tenderness in right shoulder Skin: No rashes.  Neuro: Alert, oriented to place and person, slightly confused about the time, cranial nerves II-XII grossly intact, moves all extremities normally.  Psych: Patient is not psychotic, no suicidal or hemocidal ideation.  Labs on Admission: I have personally reviewed following labs and imaging studies  CBC: Recent Labs  Lab 07/02/20 1216  WBC 16.8*  NEUTROABS 14.7*  HGB 16.7*  HCT 49.5*  MCV 87.3  PLT 326   Basic Metabolic Panel: Recent Labs  Lab 07/02/20 1216  NA 138  K 4.1  CL 104  CO2 16*  GLUCOSE 143*  BUN 41*  CREATININE 1.00  CALCIUM 10.1   GFR: Estimated Creatinine Clearance: 49.7 mL/min (by C-G formula based on SCr of 1 mg/dL). Liver Function Tests: Recent Labs  Lab 07/02/20 1216  AST 40  ALT 18  ALKPHOS 85  BILITOT 1.5*  PROT 8.8*  ALBUMIN 4.6   No results for input(s): LIPASE, AMYLASE in the last 168 hours. No results for input(s): AMMONIA in the last 168 hours. Coagulation Profile: No results for input(s): INR, PROTIME in the last 168 hours. Cardiac Enzymes: Recent Labs  Lab 07/02/20 1216  CKTOTAL 1,032*    BNP (last 3 results) No results for input(s): PROBNP in the last 8760 hours. HbA1C: No results for input(s): HGBA1C in the last 72 hours. CBG: No results for input(s): GLUCAP in the last 168 hours. Lipid Profile: No results for input(s): CHOL, HDL, LDLCALC, TRIG, CHOLHDL, LDLDIRECT in the last 72 hours. Thyroid Function Tests: No results for input(s): TSH, T4TOTAL, FREET4, T3FREE, THYROIDAB in the last 72 hours. Anemia Panel: No results for input(s): VITAMINB12, FOLATE, FERRITIN, TIBC, IRON, RETICCTPCT in the last 72 hours. Urine analysis:    Component Value Date/Time   COLORURINE STRAW (A) 04/17/2018 0038   APPEARANCEUR CLEAR (A) 04/17/2018 0038   APPEARANCEUR Clear 01/13/2013 1657   LABSPEC >1.046 (H) 04/17/2018 0038   LABSPEC 1.006 01/13/2013 1657   PHURINE 6.0 04/17/2018 0038   GLUCOSEU NEGATIVE 04/17/2018 0038   GLUCOSEU Negative 01/13/2013 1657   HGBUR NEGATIVE 04/17/2018 0038  BILIRUBINUR NEGATIVE 04/17/2018 0038   BILIRUBINUR Negative 01/13/2013 1657   KETONESUR NEGATIVE 04/17/2018 0038   PROTEINUR NEGATIVE 04/17/2018 0038   NITRITE NEGATIVE 04/17/2018 0038   LEUKOCYTESUR NEGATIVE 04/17/2018 0038   LEUKOCYTESUR Negative 01/13/2013 1657   Sepsis Labs: @LABRCNTIP (procalcitonin:4,lacticidven:4) )No results found for this or any previous visit (from the past 240 hour(s)).   Radiological Exams on Admission: DG Shoulder Right  Result Date: 07/02/2020 CLINICAL DATA:  07/04/2020 out of bed and landed on right shoulder. EXAM: RIGHT SHOULDER - 2+ VIEW COMPARISON:  None. FINDINGS: [facility] joint and glenohumeral joints are maintained. No acute fracture is identified. The visualized right ribs are intact and the visualized right lung is grossly clear. IMPRESSION: No acute bony findings. Electronically Signed   By: [facility] M.D.   On: 07/02/2020 13:37   CT Head Wo Contrast  Result Date: 07/02/2020 CLINICAL DATA:  Unwitnessed fall. EXAM: CT HEAD WITHOUT CONTRAST CT CERVICAL  SPINE WITHOUT CONTRAST TECHNIQUE: Multidetector CT imaging of the head and cervical spine was performed following the standard protocol without intravenous contrast. Multiplanar CT image reconstructions of the cervical spine were also generated. COMPARISON:  April 17, 2018. FINDINGS: CT HEAD FINDINGS Brain: Mild chronic ischemic white matter disease is noted. Mild diffuse cortical atrophy is noted. No mass effect or midline shift is noted. Stable ventricular dilatation is noted most likely due to surrounding atrophy. There is no evidence of mass lesion, hemorrhage or acute infarction. Vascular: No hyperdense vessel or unexpected calcification. Skull: Normal. Negative for fracture or focal lesion. Sinuses/Orbits: No acute finding. Other: None. CT CERVICAL SPINE FINDINGS Alignment: Mild grade 1 anterolisthesis of C4-5 is noted secondary to posterior facet joint hypertrophy. Skull base and vertebrae: No acute fracture. No primary bone lesion or focal pathologic process. Soft tissues and spinal canal: No prevertebral fluid or swelling. No visible canal hematoma. Disc levels: Severe degenerative disc disease is noted at C5-6 and C6-7. Upper chest: Negative. Other: Degenerative changes are seen involving posterior facet joints bilaterally. IMPRESSION: 1. Mild chronic ischemic white matter disease. Mild diffuse cortical atrophy. No acute intracranial abnormality seen. 2. Severe multilevel degenerative disc disease. No acute abnormality seen in the cervical spine. Electronically Signed   By: April 19, 2018 M.D.   On: 07/02/2020 13:17   CT Cervical Spine Wo Contrast  Result Date: 07/02/2020 CLINICAL DATA:  Unwitnessed fall. EXAM: CT HEAD WITHOUT CONTRAST CT CERVICAL SPINE WITHOUT CONTRAST TECHNIQUE: Multidetector CT imaging of the head and cervical spine was performed following the standard protocol without intravenous contrast. Multiplanar CT image reconstructions of the cervical spine were also generated.  COMPARISON:  April 17, 2018. FINDINGS: CT HEAD FINDINGS Brain: Mild chronic ischemic white matter disease is noted. Mild diffuse cortical atrophy is noted. No mass effect or midline shift is noted. Stable ventricular dilatation is noted most likely due to surrounding atrophy. There is no evidence of mass lesion, hemorrhage or acute infarction. Vascular: No hyperdense vessel or unexpected calcification. Skull: Normal. Negative for fracture or focal lesion. Sinuses/Orbits: No acute finding. Other: None. CT CERVICAL SPINE FINDINGS Alignment: Mild grade 1 anterolisthesis of C4-5 is noted secondary to posterior facet joint hypertrophy. Skull base and vertebrae: No acute fracture. No primary bone lesion or focal pathologic process. Soft tissues and spinal canal: No prevertebral fluid or swelling. No visible canal hematoma. Disc levels: Severe degenerative disc disease is noted at C5-6 and C6-7. Upper chest: Negative. Other: Degenerative changes are seen involving posterior facet joints bilaterally. IMPRESSION: 1. Mild chronic ischemic  white matter disease. Mild diffuse cortical atrophy. No acute intracranial abnormality seen. 2. Severe multilevel degenerative disc disease. No acute abnormality seen in the cervical spine. Electronically Signed   By: Lupita Raider M.D.   On: 07/02/2020 13:17   DG Chest Portable 1 View  Result Date: 07/02/2020 CLINICAL DATA:  Larey Seat.  Right-sided chest pain. EXAM: PORTABLE CHEST 1 VIEW COMPARISON:  03/19/2016 FINDINGS: The cardiac silhouette, mediastinal and hilar contours are within normal limits and stable. The lungs are clear. No pneumothorax or pleural effusion. Right upper lobe lung nodule is suspected. I do not see this on the prior chest x-ray. Recommend chest CT for further evaluation. No acute bony findings. IMPRESSION: 1. No acute cardiopulmonary findings. 2. Suspect right upper lobe lung nodule. Recommend chest CT for further evaluation. Electronically Signed   By: Rudie Meyer M.D.   On: 07/02/2020 13:41     EKG: I have personally reviewed.  Sinus rhythm, QTC 468, low voltage, ST depression in V3-V6.   Assessment/Plan Principal Problem:   Rhabdomyolysis Active Problems:   Stroke [facility])   Fall at home, initial encounter   Tobacco abuse   Leukocytosis   Lactic acidosis   Rhabdomyolysis: CK 1032.  Renal function okay. -place in med-surg bed for obs -IVF: 1L NS, then 75cc/h -repeat CK in AM  Fall at home, initial encounter -pt/ot  Stroke [facility]): Not taking medications at home -f/u with PCP  Tobacco abuse -Nicotine patch  Leukocytosis: WBC 16.8, but no source of infection identified.  Likely reactive. -Follow-up urinalysis -Follow-up by CBC  Lactic acidosis: Lactic acid 2.7, no source of infection, clinically not septic.  Likely due to dehydration -IV fluid as above    DVT ppx:  SQ Lovenox Code Status: Partial code ( pt was DNR before, today I discussed with the patient, and explained the meaning of CODE STATUS, patient wants to be partial code, OK for CPR, but no intubation). Family Communication: not done, no family member is at bed side.  Phone number in Epic is not correct Disposition Plan:  Anticipate discharge back to previous environment Consults called:  none Admission status: Med-surg bed for obs  Status is: Observation  The patient remains OBS appropriate and will d/c before 2 midnights.  Dispo: The patient is from: Home              Anticipated d/c is to: Home              Anticipated d/c date is: 2 days              Patient currently is not medically stable to d/c.            Date of Service 07/02/2020    Lorretta Harp [facility]   If 7PM-7AM, please contact night-coverage [email or web address] 07/02/2020, 4:28 PM

## 2020-07-02 NOTE — ED Notes (Signed)
Pt cleaned of old stool. Placed in down. Peri-care preformed.

## 2020-07-02 NOTE — ED Notes (Signed)
Pt to imaging

## 2020-07-02 NOTE — ED Notes (Addendum)
Report called to floor RN

## 2020-07-02 NOTE — ED Provider Notes (Addendum)
[facility] Emergency Department Provider Note ____________________________________________   First MD Initiated Contact with Patient 07/02/20 1141     (approximate)  I have reviewed the triage vital signs and the nursing notes.   HISTORY  Chief Complaint Fall    HPI Donna Dodson is a 79 y.o. female with PMH as noted below who presents after she was found down after several days.  The patient states that she fell about 3 to 5 days ago.  She states that she rolled out of her bed.  She then felt too weak and was unable to get up.  She was found on the floor by her husband today.  The patient reports pain to the right shoulder, but denies other acute complaints.  Past Medical History:  Diagnosis Date  . Depressed   . Pacemaker   . Stroke [facility])     Patient Active Problem List   Diagnosis Date Noted  . Stroke (HCC)   . Fall at home, initial encounter   . Tobacco abuse   . Leukocytosis   . Lactic acidosis   . 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 Rhabdomyolysis 03/19/2016    No past surgical history on file.  Prior to Admission medications   Medication Sig Start Date End Date Taking? Authorizing Provider  acetaminophen (TYLENOL) 325 MG tablet Take 2 tablets (650 mg total) by mouth every 6 (six) hours as needed for mild pain, fever or headache (or Fever >/= 101). Patient not taking: Reported on 07/02/2020 04/11/16   Enid Baas, MD  amoxicillin-clavulanate (AUGMENTIN) 875-125 MG tablet Take 1 tablet by mouth every 12 (twelve) hours. Patient not taking: Reported on 07/02/2020 04/12/16   Enedina Finner, MD  feeding supplement, ENSURE ENLIVE, (ENSURE ENLIVE) LIQD Take 237 mLs by mouth 2 (two) times daily between meals. Patient not taking: Reported on 07/02/2020 04/11/16   Enid Baas, MD  ibuprofen (ADVIL,MOTRIN) 400 MG tablet Take 1 tablet (400 mg total) by mouth every 6 (six) hours as needed for moderate pain. Patient not taking: Reported on  07/02/2020 04/11/16   Enid Baas, MD  ondansetron (ZOFRAN ODT) 4 MG disintegrating tablet Take 1 tablet (4 mg total) by mouth every 8 (eight) hours as needed for nausea or vomiting. Patient not taking: Reported on 07/02/2020 04/17/18   Merrily Brittle, MD  oxybutynin (DITROPAN-XL) 10 MG 24 hr tablet Take 1 tablet (10 mg total) by mouth daily. Patient not taking: Reported on 07/02/2020 04/11/16   Enid Baas, MD    Allergies Sulfa antibiotics  Family History  Family history unknown: Yes    Social History Social History   Tobacco Use  . Smoking status: Current Every Day Smoker    Packs/day: 0.50  . Smokeless tobacco: Never Used  Substance Use Topics  . Alcohol use: No  . Drug use: Never    Review of Systems  Constitutional: No fever.  Positive for weakness. Eyes: No redness. ENT: No neck pain. Cardiovascular: Denies chest pain. Respiratory: Denies shortness of breath. Gastrointestinal: No vomiting or diarrhea.  Genitourinary: Negative for dysuria.  Musculoskeletal: Negative for back pain.  Positive for right shoulder pain. Skin: Negative for rash. Neurological: Negative for headaches, focal weakness or numbness.   ____________________________________________   PHYSICAL EXAM:  VITAL SIGNS: ED Triage Vitals  Enc Vitals Group     BP 07/02/20 1140 (!) 128/99     Pulse Rate 07/02/20 1140 74     Resp 07/02/20 1140 18     Temp 07/02/20 1140 97.6  F (36.4 C)     Temp Source 07/02/20 1140 Oral     SpO2 07/02/20 1140 98 %     Weight 07/02/20 1140 172 lb (78 kg)     Height 07/02/20 1140 5\' 6"  (1.676 m)     Head Circumference --      Peak Flow --      Pain Score 07/02/20 1139 7     Pain Loc --      Pain Edu? --      Excl. in GC? --     Constitutional: Alert and oriented.  Weak and frail appearing but in no acute distress. Eyes: Conjunctivae are normal.  EOMI.  PERRLA. Head: Atraumatic. Nose: No congestion/rhinnorhea. Mouth/Throat: Mucous membranes are  dry. Neck: Normal range of motion.  Cardiovascular: Normal rate, regular rhythm. Grossly normal heart sounds.  Good peripheral circulation. Respiratory: Normal respiratory effort.  No retractions. Lungs CTAB. Gastrointestinal: Soft and nontender. No distention.  Genitourinary: No flank tenderness. Musculoskeletal: No lower extremity edema.  Extremities warm and well perfused.  Pain on range of motion of right arm, with tenderness to the right shoulder.  No midline spinal tenderness. Neurologic:  Normal speech and language.  5/5 motor strength and intact sensation to all extremities.  No pronator drift.  Possible very mild left facial droop. Skin:  Skin is warm and dry. No rash noted. Psychiatric: Mood and affect are normal. Speech and behavior are normal.  ____________________________________________   LABS (all labs ordered are listed, but only abnormal results are displayed)  Labs Reviewed  COMPREHENSIVE METABOLIC PANEL - Abnormal; Notable for the following components:      Result Value   CO2 16 (*)    Glucose, Bld 143 (*)    BUN 41 (*)    Total Protein 8.8 (*)    Total Bilirubin 1.5 (*)    GFR, Estimated 58 (*)    Anion gap 18 (*)    All other components within normal limits  LACTIC ACID, PLASMA - Abnormal; Notable for the following components:   Lactic Acid, Venous 2.7 (*)    All other components within normal limits  CBC WITH DIFFERENTIAL/PLATELET - Abnormal; Notable for the following components:   WBC 16.8 (*)    RBC 5.67 (*)    Hemoglobin 16.7 (*)    HCT 49.5 (*)    Neutro Abs 14.7 (*)    Abs Immature Granulocytes 0.08 (*)    All other components within normal limits  CK - Abnormal; Notable for the following components:   Total CK 1,032 (*)    All other components within normal limits  RESPIRATORY PANEL BY RT PCR (FLU A&B, COVID)  BRAIN NATRIURETIC PEPTIDE  LACTIC ACID, PLASMA  URINALYSIS, COMPLETE (UACMP) WITH MICROSCOPIC  TROPONIN I (HIGH SENSITIVITY)    ____________________________________________  EKG  ED ECG REPORT I, 07/04/20, the attending physician, personally viewed and interpreted this ECG.  Date: 07/02/2020 EKG Time: 1147 Rate: 98 Rhythm: normal sinus rhythm QRS Axis: normal Intervals: Nonspecific IVCD ST/T Wave abnormalities: Nonspecific repolarization abnormality Narrative Interpretation: Nonspecific abnormalities with no evidence of acute ischemia  ____________________________________________  RADIOLOGY  Chest x-ray interpreted by me shows no focal infiltrate or edema XR R shoulder: No acute fracture CT head: No ICH CT cervical spine: No acute fracture  ____________________________________________   PROCEDURES  Procedure(s) performed: No  Procedures  Critical Care performed: No ____________________________________________   INITIAL IMPRESSION / ASSESSMENT AND PLAN / ED COURSE  Pertinent labs & imaging results that were  available during my care of the patient were reviewed by me and considered in my medical decision making (see chart for details).  79 year old female with PMH as noted above including history of pacemaker placement and a stroke presents with generalized weakness after she rolled out of her bed several days ago and was unable to get up until found by her family member today.  The patient reports right shoulder pain but denies other acute complaints.  I reviewed the past medical records in epic.  The patient was most recently seen in the ED 2 years ago with nausea and vomiting.  She was last admitted in 2017 for mandibular abscess.  On exam today, the patient is weak and frail appearing.  Her vital signs are normal.  She is alert and oriented although initially stated that it was 1921 instead of 2021.  Mucous membranes are dry.  She has possibly a very mild left facial droop although it is unclear if this is chronic; neurologic exam is otherwise nonfocal.  Differential is very  broad but includes infection, electrolyte abnormality or other metabolic etiology, acute CVA or cardiac event.  Also concern for possible sequelae of the fall and being down on the ground including rhabdomyolysis, dehydration, UTI, intracranial hemorrhage, or injury of the shoulder.  We will obtain lab work-up, CT head and cervical spine, x-rays of the chest and right shoulder, give fluids, and reassess.  ----------------------------------------- 3:23 PM on 07/02/2020 -----------------------------------------  Imaging is negative for acute findings.  Lab work-up revealed rhabdomyolysis and an elevated lactate which I suspect is more likely related to dehydration than sepsis as she is afebrile with otherwise normal vital signs and no clinical evidence of sepsis.  I have ordered fluids.  I discussed the case with Dr. Clyde Lundborg from the hospitalist service for admission. ____________________________________________   FINAL CLINICAL IMPRESSION(S) / ED DIAGNOSES  Final diagnoses:  Non-traumatic rhabdomyolysis      NEW MEDICATIONS STARTED DURING THIS VISIT:  New Prescriptions   No medications on file     Note:  This document was prepared using Dragon voice recognition software and may include unintentional dictation errors.    Dionne Bucy, MD 07/02/20 1524    Dionne Bucy, MD 07/02/20 1524

## 2020-07-02 NOTE — ED Notes (Signed)
Pt cleaned of incontinence, linens changed

## 2020-07-03 DIAGNOSIS — W19XXXA Unspecified fall, initial encounter: Secondary | ICD-10-CM | POA: Diagnosis not present

## 2020-07-03 DIAGNOSIS — Z882 Allergy status to sulfonamides status: Secondary | ICD-10-CM | POA: Diagnosis not present

## 2020-07-03 DIAGNOSIS — Z8673 Personal history of transient ischemic attack (TIA), and cerebral infarction without residual deficits: Secondary | ICD-10-CM | POA: Diagnosis not present

## 2020-07-03 DIAGNOSIS — W06XXXA Fall from bed, initial encounter: Secondary | ICD-10-CM | POA: Diagnosis present

## 2020-07-03 DIAGNOSIS — Z20822 Contact with and (suspected) exposure to covid-19: Secondary | ICD-10-CM | POA: Diagnosis present

## 2020-07-03 DIAGNOSIS — Z950 Presence of cardiac pacemaker: Secondary | ICD-10-CM | POA: Diagnosis not present

## 2020-07-03 DIAGNOSIS — D72829 Elevated white blood cell count, unspecified: Secondary | ICD-10-CM | POA: Diagnosis not present

## 2020-07-03 DIAGNOSIS — F17210 Nicotine dependence, cigarettes, uncomplicated: Secondary | ICD-10-CM | POA: Diagnosis present

## 2020-07-03 DIAGNOSIS — Y92013 Bedroom of single-family (private) house as the place of occurrence of the external cause: Secondary | ICD-10-CM | POA: Diagnosis not present

## 2020-07-03 DIAGNOSIS — E860 Dehydration: Secondary | ICD-10-CM | POA: Diagnosis present

## 2020-07-03 DIAGNOSIS — E872 Acidosis: Secondary | ICD-10-CM | POA: Diagnosis present

## 2020-07-03 DIAGNOSIS — T796XXA Traumatic ischemia of muscle, initial encounter: Secondary | ICD-10-CM | POA: Diagnosis not present

## 2020-07-03 DIAGNOSIS — M6282 Rhabdomyolysis: Secondary | ICD-10-CM | POA: Diagnosis present

## 2020-07-03 LAB — BASIC METABOLIC PANEL
Anion gap: 7 (ref 5–15)
BUN: 33 mg/dL — ABNORMAL HIGH (ref 8–23)
CO2: 20 mmol/L — ABNORMAL LOW (ref 22–32)
Calcium: 9.1 mg/dL (ref 8.9–10.3)
Chloride: 108 mmol/L (ref 98–111)
Creatinine, Ser: 0.71 mg/dL (ref 0.44–1.00)
GFR, Estimated: >60 mL/min (ref >60)
Glucose, Bld: 100 mg/dL — ABNORMAL HIGH (ref 70–99)
Potassium: 3.7 mmol/L (ref 3.5–5.1)
Sodium: 135 mmol/L (ref 135–145)

## 2020-07-03 LAB — CBC
HCT: 41 % (ref 36.0–46.0)
Hemoglobin: 14 g/dL (ref 12.0–15.0)
MCH: 29.5 pg (ref 26.0–34.0)
MCHC: 34.1 g/dL (ref 30.0–36.0)
MCV: 86.3 fL (ref 80.0–100.0)
Platelets: 247 10*3/uL (ref 150–400)
RBC: 4.75 MIL/uL (ref 3.87–5.11)
RDW: 13.5 % (ref 11.5–15.5)
WBC: 9.4 10*3/uL (ref 4.0–10.5)
nRBC: 0 % (ref 0.0–0.2)

## 2020-07-03 LAB — LACTIC ACID, PLASMA: Lactic Acid, Venous: 1.1 mmol/L (ref 0.5–1.9)

## 2020-07-03 LAB — CK: Total CK: 547 U/L — ABNORMAL HIGH (ref 38–234)

## 2020-07-03 MED ORDER — ASPIRIN EC 81 MG PO TBEC
81.0000 mg | DELAYED_RELEASE_TABLET | Freq: Every day | ORAL | Status: DC
  Administered 2020-07-03 – 2020-07-04 (×2): 81 mg via ORAL
  Filled 2020-07-03 (×2): qty 1

## 2020-07-03 MED ORDER — OXYBUTYNIN CHLORIDE ER 10 MG PO TB24
10.0000 mg | ORAL_TABLET | Freq: Every day | ORAL | Status: DC
  Administered 2020-07-03 – 2020-07-04 (×2): 10 mg via ORAL
  Filled 2020-07-03 (×2): qty 1

## 2020-07-03 NOTE — Care Management Obs Status (Signed)
MEDICARE OBSERVATION STATUS NOTIFICATION   Patient Details  Name: Donna Dodson MRN: [id number] Date of Birth: 06-09-1941   Medicare Observation Status Notification Given:  Yes    Allayne Butcher, RN 07/03/2020, 9:41 AM

## 2020-07-03 NOTE — [place] FL2 (Signed)
°  [place] MEDICAID FL2 LEVEL OF CARE SCREENING TOOL     IDENTIFICATION  Patient Name: Donna Dodson Birthdate: 06/26/1941 Sex: female Admission Date (Current Location): 07/02/2020  [facility] and [place] Number:  Chiropodist and Address:  [facility], [address], [place], [place] [address]      Provider Number: [id number]  Attending Physician Name and Address:  Joycelyn Das, MD  Relative Name and Phone Number:  Fausto Skillern    Current Level of Care: Hospital Recommended Level of Care: Skilled Nursing Facility Prior Approval Number:    Date Approved/Denied:   PASRR Number: pending  Discharge Plan: SNF    Current Diagnoses: Patient Active Problem List   Diagnosis Date Noted   History of stroke    Fall at home, initial encounter    Tobacco abuse    Leukocytosis    Lactic acidosis    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   Rhabdomyolysis 03/19/2016    Orientation RESPIRATION BLADDER Height & Weight     Self, Time, Situation, Place  Normal Incontinent Weight: 78 kg Height:  5\' 6"  (167.6 cm)  BEHAVIORAL SYMPTOMS/MOOD NEUROLOGICAL BOWEL NUTRITION STATUS      Incontinent Diet (heart healthy)  AMBULATORY STATUS COMMUNICATION OF NEEDS Skin   Limited Assist Verbally Bruising (arms and buttocks)                       Personal Care Assistance Level of Assistance  Bathing, Feeding, Dressing Bathing Assistance: Limited assistance Feeding assistance: Limited assistance Dressing Assistance: Limited assistance     Functional Limitations Info             SPECIAL CARE FACTORS FREQUENCY  PT (By licensed PT), OT (By licensed OT)     PT Frequency: 5 times per week OT Frequency: 5 times per week            Contractures Contractures Info: Not present    Additional Factors Info  Code Status, Allergies Code Status Info: DNR Allergies Info: sulf antibiotics           Current Medications (07/03/2020):  This is the  current hospital active medication list Current Facility-Administered Medications  Medication Dose Route Frequency Provider Last Rate Last Admin   0.9 %  sodium chloride infusion   Intravenous Continuous 07/05/2020, MD 75 mL/hr at 07/03/20 1226 New Bag at 07/03/20 1226   acetaminophen (TYLENOL) tablet 650 mg  650 mg Oral Q6H PRN 07/05/20, MD       aspirin EC tablet 81 mg  81 mg Oral Daily Pokhrel, Laxman, MD   81 mg at 07/03/20 1225   enoxaparin (LOVENOX) injection 40 mg  40 mg Subcutaneous Q24H 07/05/20, MD   40 mg at 07/02/20 2132   nicotine (NICODERM CQ - dosed in mg/24 hours) patch 21 mg  21 mg Transdermal Daily 2133, MD       ondansetron [facility]) injection 4 mg  4 mg Intravenous Q8H PRN [facility], MD       oxybutynin (DITROPAN-XL) 24 hr tablet 10 mg  10 mg Oral Daily Pokhrel, Laxman, MD   10 mg at 07/03/20 1225     Discharge Medications: Please see discharge summary for a list of discharge medications.  Relevant Imaging Results:  Relevant Lab Results:   Additional Information SS# 07/05/20  [id number], RN

## 2020-07-03 NOTE — TOC Progression Note (Signed)
Transition of Care [facility]) - Progression Note    Patient Details  Name: Donna Dodson MRN: [id number] Date of Birth: 05-09-1941  [facility]) CM/SW Contact  Allayne Butcher, RN Phone Number: 07/03/2020, 2:07 PM  Clinical Narrative:    RNCM has been unable to reach the patient's son- contact information is incorrect.  RNCM was able to reach a friend [place] Pinnix [phone].  Louie Casa reports that she used to work for the patient and went with her to appointments and to the hospital, but she has not been working for the patient in several years.  Louie Casa does not know how to reach the son but will call the patient's brother, Jonny Ruiz, [phone].    [facility] is where the patient requested to go but they are unable to offer a bed.  [facility] will offer a bed and RNCM will begin insurance authorization.     Expected Discharge Plan: Skilled Nursing Facility Barriers to Discharge: Continued Medical Work up  Expected Discharge Plan and Services Expected Discharge Plan: Skilled Nursing Facility   Discharge Planning Services: CM Consult Post Acute Care Choice: Skilled Nursing Facility Living arrangements for the past 2 months: Apartment                                       Social Determinants of Health (SDOH) Interventions    Readmission Risk Interventions No flowsheet data found.

## 2020-07-03 NOTE — Evaluation (Signed)
Physical Therapy Evaluation Patient Details Name: Donna Dodson MRN: [id number] DOB: May 03, 1941 Today's Date: 07/03/2020   History of Present Illness  Pt is a 79 y.o. female presenting to hospital 10/25 s/p fall rolling OOB and too weak to get up (on floor several days); found by son.  Pt admitted with rhabdomyolysis and fall at home.  PMH includes pacemaker, stroke, mandibular abscess, and rhabdomyolysis.  Clinical Impression  Prior to hospital admission, pt was ambulatory with rollator; lives alone in 1 level home with 3 STE with railing.  Currently pt is min to mod assist with bed mobility; min to mod assist with transfers with RW use; and min to mod assist to ambulate a few feet bed to recliner with RW.  Posterior lean noted in standing activities requiring cueing and assist to maintain upright.  Pt reporting increased R shoulder pain with movement/activities but no pain at rest.  Generalized weakness and decreased activity tolerance noted.  Pt would benefit from skilled PT to address noted impairments and functional limitations (see below for any additional details).  Upon hospital discharge, pt would benefit from STR.    Follow Up Recommendations SNF    Equipment Recommendations  Rolling walker with 5" wheels;3in1 (PT)    Recommendations for Other Services OT consult     Precautions / Restrictions Precautions Precautions: Fall Restrictions Weight Bearing Restrictions: No      Mobility  Bed Mobility Overal bed mobility: Needs Assistance Bed Mobility: Supine to Sit     Supine to sit: Min assist;Mod assist;HOB elevated     General bed mobility comments: assist for trunk    Transfers Overall transfer level: Needs assistance Equipment used: Rolling walker (2 wheeled) Transfers: Sit to/from Stand Sit to Stand: Min assist;Mod assist         General transfer comment: assist to initiate and come to full stand up from bed; assist to control descent sitting into recliner  and lining up to chair prior to sitting  Ambulation/Gait Ambulation/Gait assistance: Min assist;Mod assist Gait Distance (Feet): 3 Feet (bed to recliner) Assistive device: Rolling walker (2 wheeled)   Gait velocity: decreased   General Gait Details: decrease B LE step length/foot clearance; increased effort to take steps  Stairs            Wheelchair Mobility    Modified Rankin (Stroke Patients Only)       Balance Overall balance assessment: Needs assistance Sitting-balance support: No upper extremity supported;Feet supported Sitting balance-Leahy Scale: Fair Sitting balance - Comments: steady static sitting but requiring at least single UE support for dynamic sitting balance   Standing balance support: Single extremity supported Standing balance-Leahy Scale: Poor Standing balance comment: pt requiring at least single UE support for static standing balance; posterior lean noted in standing requiring assist and cueing to maintain upright                             Pertinent Vitals/Pain Pain Assessment: Faces Faces Pain Scale: Hurts a little bit Pain Location: R shoulder Pain Descriptors / Indicators: Aching;Sore Pain Intervention(s): Limited activity within patient's tolerance;Monitored during session;Repositioned  Vitals (HR and O2 on room air) stable and WFL throughout treatment session.    Home Living Family/patient expects to be discharged to:: Private residence Living Arrangements: Alone Available Help at Discharge: Family;Available PRN/intermittently Type of Home: House Home Access: Stairs to enter Entrance Stairs-Rails:  (pt not sure which side single rail on) Entrance Stairs-Number of  Steps: 3 Home Layout: One level Home Equipment: Walker - 4 wheels;Grab bars - tub/shower;Shower seat - built in      Prior Function Level of Independence: Independent with assistive device(s)         Comments: Ambulatory with rollator; pt reports no other  falls in past 6 months     Hand Dominance        Extremity/Trunk Assessment   Upper Extremity Assessment Upper Extremity Assessment: Defer to OT evaluation (Very limited R shoulder flexion/abduction d/t R shoulder pain)    Lower Extremity Assessment Lower Extremity Assessment: Generalized weakness    Cervical / Trunk Assessment Cervical / Trunk Assessment: Normal  Communication   Communication: No difficulties  Cognition Arousal/Alertness: Awake/alert Behavior During Therapy: WFL for tasks assessed/performed Overall Cognitive Status: Within Functional Limits for tasks assessed                                        General Comments General comments (skin integrity, edema, etc.): After pt stood from bed, blood noted on bed sheet; NT present and assessed pt's bottom and reporting sore on pt's bottom and NT notified nurse of findings.  Nursing cleared pt for participation in physical therapy.  Pt agreeable to PT session.    Exercises  Transfers   Assessment/Plan    PT Assessment Patient needs continued PT services  PT Problem List Decreased strength;Decreased activity tolerance;Decreased balance;Decreased mobility;Decreased knowledge of use of DME;Pain;Decreased skin integrity       PT Treatment Interventions DME instruction;Gait training;Stair training;Functional mobility training;Therapeutic activities;Therapeutic exercise;Balance training;Patient/family education    PT Goals (Current goals can be found in the Care Plan section)  Acute Rehab PT Goals Patient Stated Goal: to improve strength and mobility PT Goal Formulation: With patient Time For Goal Achievement: 07/17/20 Potential to Achieve Goals: Good    Frequency Min 2X/week   Barriers to discharge Decreased caregiver support      Co-evaluation               AM-PAC PT "6 Clicks" Mobility  Outcome Measure Help needed turning from your back to your side while in a flat bed without  using bedrails?: None Help needed moving from lying on your back to sitting on the side of a flat bed without using bedrails?: A Little Help needed moving to and from a bed to a chair (including a wheelchair)?: A Lot Help needed standing up from a chair using your arms (e.g., wheelchair or bedside chair)?: A Lot Help needed to walk in hospital room?: A Lot Help needed climbing 3-5 steps with a railing? : Total 6 Click Score: 14    End of Session Equipment Utilized During Treatment: Gait belt Activity Tolerance: Patient limited by fatigue Patient left: in chair;with call bell/phone within reach;with chair alarm set Nurse Communication: Mobility status;Precautions;Other (comment) (NT noted sore on pt's bottom; IV beeping) PT Visit Diagnosis: Unsteadiness on feet (R26.81);Other abnormalities of gait and mobility (R26.89);Muscle weakness (generalized) (M62.81);History of falling (Z91.81);Pain Pain - Right/Left: Right Pain - part of body: Shoulder    Time: 5361-4431 PT Time Calculation (min) (ACUTE ONLY): 38 min   Charges:   PT Evaluation $PT Eval Low Complexity: 1 Low PT Treatments $Therapeutic Activity: 23-37 mins       Rosalynd Mcwright, PT 07/03/20, 11:08 AM

## 2020-07-03 NOTE — Progress Notes (Signed)
Please be advised that the above-named patient will require a short-term nursing home stay-anticipated 30 days or less for rehabilitation and strengthening. The plan is for return home.  

## 2020-07-03 NOTE — TOC Initial Note (Signed)
Transition of Care [facility]) - Initial/Assessment Note    Patient Details  Name: Donna Dodson MRN: [id number] Date of Birth: [birth year]  Transition of Care [facility]) CM/SW Contact:    Allayne Butcher, RN Phone Number: 07/03/2020, 9:45 AM  Clinical Narrative:                 Patient placed under observation with rhabdomyolysis after fall at home.  Patient is alert sitting up eating breakfast this morning.  RNCM introduced self and explained role in discharge planning.  Patient reports that she is from home and lives alone.  Patient walks with a walker.  Patient has a son, Onalee Hua, who she gives permission to speak with.  Patient reports that she has been to SNF in the past but cannot tell me the name of the facility.  PT consult is pending.  RNCM will wait for recommendation from PT for either home health or SNF.    Expected Discharge Plan: Home w Home Health Services Barriers to Discharge: Continued Medical Work up   Patient Goals and CMS Choice Patient states their goals for this hospitalization and ongoing recovery are:: Patient would be willing to go to rehab if recommended      Expected Discharge Plan and Services Expected Discharge Plan: Home w Home Health Services   Discharge Planning Services: CM Consult   Living arrangements for the past 2 months: Apartment                                      Prior Living Arrangements/Services Living arrangements for the past 2 months: Apartment Lives with:: Self Patient language and need for interpreter reviewed:: Yes Do you feel safe going back to the place where you live?: Yes      Need for Family Participation in Patient Care: Yes (Comment) (fall) Care giver support system in place?: Yes (comment) (son) Current home services: DME (walker) Criminal Activity/Legal Involvement Pertinent to Current Situation/Hospitalization: No - Comment as needed  Activities of Daily Living Home Assistive Devices/Equipment: None ADL Screening  (condition at time of admission) Patient's cognitive ability adequate to safely complete daily activities?: Yes Is the patient deaf or have difficulty hearing?: No Does the patient have difficulty seeing, even when wearing glasses/contacts?: No Does the patient have difficulty concentrating, remembering, or making decisions?: Yes Patient able to express need for assistance with ADLs?: Yes Does the patient have difficulty dressing or bathing?: Yes Independently performs ADLs?: No Does the patient have difficulty walking or climbing stairs?: Yes Weakness of Legs: Both Weakness of Arms/Hands: None  Permission Sought/Granted Permission sought to share information with : Case Manager, Family Supports Permission granted to share information with : Yes, Verbal Permission Granted  Share Information with NAME: Onalee Hua     Permission granted to share info w Relationship: son     Emotional Assessment Appearance:: Appears stated age Attitude/Demeanor/Rapport: Engaged Affect (typically observed): Accepting Orientation: : Oriented to Self, Oriented to Place, Oriented to  Time, Oriented to Situation Alcohol / Substance Use: Not Applicable Psych Involvement: No (comment)  Admission diagnosis:  Rhabdomyolysis [M62.82] Non-traumatic rhabdomyolysis [M62.82] Patient Active Problem List   Diagnosis Date Noted  . Stroke (HCC)   . Fall at home, initial encounter   . Tobacco abuse   . Leukocytosis   . Lactic acidosis   . 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 Rhabdomyolysis 03/19/2016   PCP:  Patient, No Pcp Per  Pharmacy:  No Pharmacies Listed    Social Determinants of Health (SDOH) Interventions    Readmission Risk Interventions No flowsheet data found.

## 2020-07-03 NOTE — Evaluation (Signed)
Occupational Therapy Evaluation Patient Details Name: Donna Dodson MRN: [id number] DOB: December 27, 1940 Today's Date: 07/03/2020    History of Present Illness Pt is a 79 y.o. female presenting to hospital 10/25 s/p fall rolling OOB and too weak to get up (on floor several days); found by son.  Pt admitted with rhabdomyolysis and fall at home.  PMH includes pacemaker, stroke, mandibular abscess, and rhabdomyolysis.   Clinical Impression   [name] was seen for OT evaluation this date. Prior to hospital admission, pt was MOD I for mobility and ADLs using 4WW, denies falls hx. Pt lives alone in one level home c 3 STE. Pt presents to acute OT demonstrating impaired ADL performance and functional mobility 2/2 decreased safety awareness, functional strength/balance/ROM deficits, and decreased activity tolerance. Pt currently requires SETUP self-feeding. MIN A for UBD seated EOC. MIN A for ADL t/f. Anticipate MIN A for standing grooming tasks - assist for balance 2/2 posterior lean. Pt demonstrates AROM ~90* pain free for shoulder flexion/abduction. PROM shoulder flexion ~120* - pt endorses mild pain. Pt would benefit from skilled OT to address noted impairments and functional limitations (see below for any additional details) in order to maximize safety and independence while minimizing falls risk and caregiver burden. Upon hospital discharge, recommend STR to maximize pt safety and return to PLOF.     Follow Up Recommendations  SNF    Equipment Recommendations  Other (comment) (TBD )    Recommendations for Other Services       Precautions / Restrictions Precautions Precautions: Fall Restrictions Weight Bearing Restrictions: No      Mobility Bed Mobility Overal bed mobility: Needs Assistance   General bed mobility comments: Pt received and left up in chair    Transfers Overall transfer level: Needs assistance Equipment used: 1 person hand held assist Transfers: Sit to/from Stand Sit to  Stand: Min assist       General transfer comment: assist for lift off and eccentric control     Balance Overall balance assessment: Needs assistance Sitting-balance support: No upper extremity supported;Feet supported Sitting balance-Leahy Scale: Fair Sitting balance - Comments: steady static sitting but requiring at least single UE support for dynamic sitting balance   Standing balance support: Single extremity supported Standing balance-Leahy Scale: Poor Standing balance comment: posterior lean requiring MIN A         ADL either performed or assessed with clinical judgement   ADL Overall ADL's : Needs assistance/impaired      General ADL Comments: SETUP self-feeding. MIN A for UBD seated EOC. MIN A for ADL t/f. Anticipate MIN A for standing grooming tasks - assist for balance                  Pertinent Vitals/Pain Pain Assessment: Faces Faces Pain Scale: Hurts a little bit Pain Location: R shoulder Pain Descriptors / Indicators: Aching;Sore Pain Intervention(s): Limited activity within patient's tolerance;Monitored during session     Hand Dominance Right   Extremity/Trunk Assessment Upper Extremity Assessment Upper Extremity Assessment: RUE deficits/detail RUE Deficits / Details: ~90* AROM pain free for shoulder flexion/abduction. PROM shoulder flexion ~120* - pt endorses mild pain   Lower Extremity Assessment Lower Extremity Assessment: Generalized weakness   Cervical / Trunk Assessment Cervical / Trunk Assessment: Normal   Communication Communication Communication: No difficulties   Cognition Arousal/Alertness: Awake/alert Behavior During Therapy: WFL for tasks assessed/performed Overall Cognitive Status: Within Functional Limits for tasks assessed            General  Comments  Endorses dizziness in standing - resolved c rest    Exercises Exercises: Other exercises Other Exercises Other Exercises: Pt educated re: OT role, DME recs, HEP, pain  mgmt, falls prevention Other Exercises: self-feeding, simulated grooming/UBD, sit<>stand, sitting/standing balance/tolerance        Home Living Family/patient expects to be discharged to:: Private residence Living Arrangements: Alone Available Help at Discharge: Family;Available PRN/intermittently Type of Home: House Home Access: Stairs to enter [employer] of Steps: 3 Entrance Stairs-Rails:  (SINGLE RAIL) Home Layout: One level     Bathroom Shower/Tub: Producer, television/film/video: Standard     Home Equipment: Walker - 4 wheels;Grab bars - tub/shower;Shower seat - built in          Prior Functioning/Environment Level of Independence: Independent with assistive device(s)        Comments: Ambulatory with rollator; pt reports no other falls in past 6 months        OT Problem List: Decreased strength;Decreased range of motion;Decreased activity tolerance;Impaired balance (sitting and/or standing);Decreased safety awareness      OT Treatment/Interventions: Self-care/ADL training;Therapeutic exercise;Energy conservation;DME and/or AE instruction;Therapeutic activities;Patient/family education;Balance training    OT Goals(Current goals can be found in the care plan section) Acute Rehab OT Goals Patient Stated Goal: to improve strength and mobility OT Goal Formulation: With patient Time For Goal Achievement: 07/17/20 Potential to Achieve Goals: Good ADL Goals Pt Will Perform Grooming: with min assist;standing (c LRAD PRN) Pt Will Perform Lower Body Dressing: with min guard assist;sit to/from stand (c LRAD PRN) Pt Will Transfer to Toilet: with min guard assist;ambulating;bedside commode (c LRAD PRN)  OT Frequency: Min 1X/week   Barriers to D/C: Decreased caregiver support             AM-PAC OT "6 Clicks" Daily Activity     Outcome Measure Help from another person eating meals?: None Help from another person taking care of personal grooming?: A  Little Help from another person toileting, which includes using toliet, bedpan, or urinal?: A Little Help from another person bathing (including washing, rinsing, drying)?: A Little Help from another person to put on and taking off regular upper body clothing?: A Little Help from another person to put on and taking off regular lower body clothing?: A Lot 6 Click Score: 18   End of Session    Activity Tolerance: Patient limited by pain;Patient tolerated treatment well Patient left: in chair;with call bell/phone within reach;with chair alarm set  OT Visit Diagnosis: Other abnormalities of gait and mobility (R26.89)                Time: 6063-0160 OT Time Calculation (min): 10 min Charges:  OT General Charges $OT Visit: 1 Visit OT Evaluation $OT Eval Low Complexity: 1 Low  Kathie Dike, M.S. OTR/L  07/03/20, 12:01 PM  ascom [phone]

## 2020-07-03 NOTE — Progress Notes (Addendum)
PROGRESS NOTE  Donna Dodson WUJ:[id number] DOB: 25-Jul-1941 DOA: 07/02/2020 PCP: Patient, No Pcp Per   LOS: 0 days   Brief narrative: As per HPI,  Donna Dodson is a 79 y.o. female with medical history significant of stroke, depression, pacemaker placement, rhabdomyolysis, tobacco abuse, presented to the hospital with complains of fall after rolling over from the bed, at least 3 days ago.  Patient also had generalized weakness but denied any focal weakness of the body.  Chief complaint of right shoulder pain after a fall.  In the ED patient was noted to have leukocytosis and lactate was elevated.  Chest x-ray was negative for any infiltrates but questionable right upper lobe nodule.  CT head was negative for acute findings.  CT C-spine was negative except for degenerative changes.  X-ray of the right shoulder was negative for bony fracture.  Patient was then considered for observation to the hospital for further evaluation and treatment.  Assessment/Plan:  Principal Problem:   Rhabdomyolysis Active Problems:   History of Stroke (HCC)   Fall at home, initial encounter   Tobacco abuse   Leukocytosis   Lactic acidosis    Acute rhabdomyolysis: CK 1032 on presentation.  Likely secondary to fall.  CK level has improved.  Received IV fluids.  Continue fluids for 1 more day.  Fall at home, initial encounter History of stroke in the past.  Physical therapy has seen the patient and recommended skilled nursing facility placement on discharge.  History of stroke Not taking medications at home.  Will benefit from her taking aspirin daily.  We will add low-dose aspirin.  Check A1c and fasting lipid profile.  Might benefit from statin as well.  Right shoulder pain.  X-ray was negative except for suspected right upper lung nodule..  Will provide topical treatment and symptomatic care.  Warm compress.  Right upper lung nodule.  Suspected on the chest x-ray.  Will need follow-up since patient is  also a smoker.  Could consider CT scan of the chest prior to discharge/PCP..  Tobacco abuse Counseling was done.  Patient has been started nicotine patch.  We will continue with that.  Leukocytosis:  Likely reactive.  WBC 16.8 on presentation.  Flu and Covid test was negative.  Urinalysis showed some ketones and protein.  Rare bacteria but nitrite was negative.  No WBC in urine.  Patient was volume depleted.  Elevated lactate on presentation.  Likely secondary to volume depletion.  No evidence of infection.   Lactic acid 2.7 on presentation.  This has improved to 1.1 after IV fluids.  Suspected medication noncompliance.  Patient was supposed to multiple medications at home but this is now on it.  I did not see any aspirin on the medicine list as well.  Generalized weakness and debility deconditioning.  Patient was seen by physical therapy today.  Recommended skilled nursing facility placement.    DVT prophylaxis: enoxaparin (LOVENOX) injection 40 mg Start: 07/02/20 2200   Code Status: Partial code  Family Communication: I tried to reach the patient's son on the phone number available but was unable to reach him.  Status is: Observation  The patient will require care spanning > 2 midnights and should be moved to inpatient because: IV treatments appropriate due to intensity of illness or inability to take PO and Need for skilled nursing facility placement and rehabilitation, further monitoring, continued IV fluids  Dispo: The patient is from: Home  Anticipated d/c is to: SNF as per physical therapy recommendation.  I spoke at the interdisciplinary meeting today.              Anticipated d/c date is: 2 days              Patient currently is not medically stable to d/c.  Consultants:  None  Procedures:  None  Antibiotics:  . None  Anti-infectives (From admission, onward)   None     Subjective: Today, patient was seen and examined at bedside.  Patient complains  of right shoulder pain.  Denies any nausea vomiting abdominal pain, no chest pain or shortness of breath.  Objective: Vitals:   07/03/20 0449 07/03/20 0817  BP: 105/69 109/65  Pulse: 62 60  Resp: 20 16  Temp: 97.8 F (36.6 C) 97.6 F (36.4 C)  SpO2: 100% 100%   No intake or output data in the 24 hours ending 07/03/20 1142 Filed Weights   07/02/20 1140  Weight: 78 kg   Body mass index is 27.76 kg/m.   Physical Exam: GENERAL: Patient is alert awake and communicative.  Oriented to place and person.. Not in obvious distress. HENT: No scleral pallor or icterus. Pupils equally reactive to light. Oral mucosa is moist NECK: is supple, no gross swelling noted. CHEST: Clear to auscultation. No crackles or wheezes.  Diminished breath sounds bilaterally.  Left chest wall pacemaker in place. CVS: S1 and S2 heard, no murmur. Regular rate and rhythm.  ABDOMEN: Soft, non-tender, bowel sounds are present. EXTREMITIES: No edema.  Right shoulder tenderness on palpation.  No external bruise noted. CNS: Cranial nerves are intact. No focal motor deficits. SKIN: warm and dry without rashes.  Data Review: I have personally reviewed the following laboratory data and studies,  CBC: Recent Labs  Lab 07/02/20 1216 07/03/20 0528  WBC 16.8* 9.4  NEUTROABS 14.7*  --   HGB 16.7* 14.0  HCT 49.5* 41.0  MCV 87.3 86.3  PLT 326 247   Basic Metabolic Panel: Recent Labs  Lab 07/02/20 1216 07/03/20 0528  NA 138 135  K 4.1 3.7  CL 104 108  CO2 16* 20*  GLUCOSE 143* 100*  BUN 41* 33*  CREATININE 1.00 0.71  CALCIUM 10.1 9.1   Liver Function Tests: Recent Labs  Lab 07/02/20 1216  AST 40  ALT 18  ALKPHOS 85  BILITOT 1.5*  PROT 8.8*  ALBUMIN 4.6   No results for input(s): LIPASE, AMYLASE in the last 168 hours. No results for input(s): AMMONIA in the last 168 hours. Cardiac Enzymes: Recent Labs  Lab 07/02/20 1216 07/03/20 0528  CKTOTAL 1,032* 547*   BNP (last 3 results) Recent Labs     07/02/20 1218  BNP 79.7    ProBNP (last 3 results) No results for input(s): PROBNP in the last 8760 hours.  CBG: No results for input(s): GLUCAP in the last 168 hours. Recent Results (from the past 240 hour(s))  Respiratory Panel by RT PCR (Flu A&B, Covid) - Nasopharyngeal Swab     Status: None   Collection Time: 07/02/20  3:43 PM   Specimen: Nasopharyngeal Swab  Result Value Ref Range Status   SARS Coronavirus 2 by RT PCR NEGATIVE NEGATIVE Final    Comment: (NOTE) SARS-CoV-2 target nucleic acids are NOT DETECTED.  The SARS-CoV-2 RNA is generally detectable in upper respiratoy specimens during the acute phase of infection. The lowest concentration of SARS-CoV-2 viral copies this assay can detect is 131 copies/mL. A negative result does  not preclude SARS-Cov-2 infection and should not be used as the sole basis for treatment or other patient management decisions. A negative result may occur with  improper specimen collection/handling, submission of specimen other than nasopharyngeal swab, presence of viral mutation(s) within the areas targeted by this assay, and inadequate number of viral copies (<131 copies/mL). A negative result must be combined with clinical observations, patient history, and epidemiological information. The expected result is Negative.  Fact Sheet for Patients:  https://www.moore.com/  Fact Sheet for Healthcare Providers:  https://www.young.biz/  This test is no t yet approved or cleared by the [place] FDA and  has been authorized for detection and/or diagnosis of SARS-CoV-2 by FDA under an Emergency Use Authorization (EUA). This EUA will remain  in effect (meaning this test can be used) for the duration of the COVID-19 declaration under Section 564(b)(1) of the Act, 21 U.S.C. section 360bbb-3(b)(1), unless the authorization is terminated or revoked sooner.     Influenza A by PCR NEGATIVE NEGATIVE Final    Influenza B by PCR NEGATIVE NEGATIVE Final    Comment: (NOTE) The Xpert Xpress SARS-CoV-2/FLU/RSV assay is intended as an aid in  the diagnosis of influenza from Nasopharyngeal swab specimens and  should not be used as a sole basis for treatment. Nasal washings and  aspirates are unacceptable for Xpert Xpress SARS-CoV-2/FLU/RSV  testing.  Fact Sheet for Patients: https://www.moore.com/  Fact Sheet for Healthcare Providers: https://www.young.biz/  This test is not yet approved or cleared by the [place] FDA and  has been authorized for detection and/or diagnosis of SARS-CoV-2 by  FDA under an Emergency Use Authorization (EUA). This EUA will remain  in effect (meaning this test can be used) for the duration of the  Covid-19 declaration under Section 564(b)(1) of the Act, 21  U.S.C. section 360bbb-3(b)(1), unless the authorization is  terminated or revoked. Performed at [facility], [address] [place], [place] [address]      Studies: DG Shoulder Right  Result Date: 07/02/2020 CLINICAL DATA:  Larey Seat out of bed and landed on right shoulder. EXAM: RIGHT SHOULDER - 2+ VIEW COMPARISON:  None. FINDINGS: [facility] joint and glenohumeral joints are maintained. No acute fracture is identified. The visualized right ribs are intact and the visualized right lung is grossly clear. IMPRESSION: No acute bony findings. Electronically Signed   By: Rudie Meyer M.D.   On: 07/02/2020 13:37   CT Head Wo Contrast  Result Date: 07/02/2020 CLINICAL DATA:  Unwitnessed fall. EXAM: CT HEAD WITHOUT CONTRAST CT CERVICAL SPINE WITHOUT CONTRAST TECHNIQUE: Multidetector CT imaging of the head and cervical spine was performed following the standard protocol without intravenous contrast. Multiplanar CT image reconstructions of the cervical spine were also generated. COMPARISON:  April 17, 2018. FINDINGS: CT HEAD FINDINGS Brain: Mild chronic ischemic white  matter disease is noted. Mild diffuse cortical atrophy is noted. No mass effect or midline shift is noted. Stable ventricular dilatation is noted most likely due to surrounding atrophy. There is no evidence of mass lesion, hemorrhage or acute infarction. Vascular: No hyperdense vessel or unexpected calcification. Skull: Normal. Negative for fracture or focal lesion. Sinuses/Orbits: No acute finding. Other: None. CT CERVICAL SPINE FINDINGS Alignment: Mild grade 1 anterolisthesis of C4-5 is noted secondary to posterior facet joint hypertrophy. Skull base and vertebrae: No acute fracture. No primary bone lesion or focal pathologic process. Soft tissues and spinal canal: No prevertebral fluid or swelling. No visible canal hematoma. Disc levels: Severe degenerative disc disease is noted  at C5-6 and C6-7. Upper chest: Negative. Other: Degenerative changes are seen involving posterior facet joints bilaterally. IMPRESSION: 1. Mild chronic ischemic white matter disease. Mild diffuse cortical atrophy. No acute intracranial abnormality seen. 2. Severe multilevel degenerative disc disease. No acute abnormality seen in the cervical spine. Electronically Signed   By: Lupita RaiderJames  Green Jr M.D.   On: 07/02/2020 13:17   CT Cervical Spine Wo Contrast  Result Date: 07/02/2020 CLINICAL DATA:  Unwitnessed fall. EXAM: CT HEAD WITHOUT CONTRAST CT CERVICAL SPINE WITHOUT CONTRAST TECHNIQUE: Multidetector CT imaging of the head and cervical spine was performed following the standard protocol without intravenous contrast. Multiplanar CT image reconstructions of the cervical spine were also generated. COMPARISON:  April 17, 2018. FINDINGS: CT HEAD FINDINGS Brain: Mild chronic ischemic white matter disease is noted. Mild diffuse cortical atrophy is noted. No mass effect or midline shift is noted. Stable ventricular dilatation is noted most likely due to surrounding atrophy. There is no evidence of mass lesion, hemorrhage or acute infarction.  Vascular: No hyperdense vessel or unexpected calcification. Skull: Normal. Negative for fracture or focal lesion. Sinuses/Orbits: No acute finding. Other: None. CT CERVICAL SPINE FINDINGS Alignment: Mild grade 1 anterolisthesis of C4-5 is noted secondary to posterior facet joint hypertrophy. Skull base and vertebrae: No acute fracture. No primary bone lesion or focal pathologic process. Soft tissues and spinal canal: No prevertebral fluid or swelling. No visible canal hematoma. Disc levels: Severe degenerative disc disease is noted at C5-6 and C6-7. Upper chest: Negative. Other: Degenerative changes are seen involving posterior facet joints bilaterally. IMPRESSION: 1. Mild chronic ischemic white matter disease. Mild diffuse cortical atrophy. No acute intracranial abnormality seen. 2. Severe multilevel degenerative disc disease. No acute abnormality seen in the cervical spine. Electronically Signed   By: Lupita RaiderJames  Green Jr M.D.   On: 07/02/2020 13:17   DG Chest Portable 1 View  Result Date: 07/02/2020 CLINICAL DATA:  Larey SeatFell.  Right-sided chest pain. EXAM: PORTABLE CHEST 1 VIEW COMPARISON:  03/19/2016 FINDINGS: The cardiac silhouette, mediastinal and hilar contours are within normal limits and stable. The lungs are clear. No pneumothorax or pleural effusion. Right upper lobe lung nodule is suspected. I do not see this on the prior chest x-ray. Recommend chest CT for further evaluation. No acute bony findings. IMPRESSION: 1. No acute cardiopulmonary findings. 2. Suspect right upper lobe lung nodule. Recommend chest CT for further evaluation. Electronically Signed   By: Rudie MeyerP.  Gallerani M.D.   On: 07/02/2020 13:41      Joycelyn DasLaxman Navarre Diana, MD  [facility] 07/03/2020

## 2020-07-03 NOTE — TOC Progression Note (Signed)
Transition of Care [facility]) - Progression Note    Patient Details  Name: Donna Dodson MRN: [id number] Date of Birth: [birth year]  [facility]) CM/SW Contact  Allayne Butcher, RN Phone Number: 07/03/2020, 3:43 PM  Clinical Narrative:    [facility] completed and approved.  [employer] ID [id number]- approved from 10/26-10/28, next review on 10/28.   Expected Discharge Plan: Skilled Nursing Facility Barriers to Discharge: Continued Medical Work up  Expected Discharge Plan and Services Expected Discharge Plan: Skilled Nursing Facility   Discharge Planning Services: CM Consult Post Acute Care Choice: Skilled Nursing Facility Living arrangements for the past 2 months: Apartment                                       Social Determinants of Health (SDOH) Interventions    Readmission Risk Interventions No flowsheet data found.

## 2020-07-04 DIAGNOSIS — R627 Adult failure to thrive: Secondary | ICD-10-CM

## 2020-07-04 DIAGNOSIS — T796XXA Traumatic ischemia of muscle, initial encounter: Secondary | ICD-10-CM

## 2020-07-04 LAB — CBC
HCT: 39.6 % (ref 36.0–46.0)
Hemoglobin: 13.3 g/dL (ref 12.0–15.0)
MCH: 29.4 pg (ref 26.0–34.0)
MCHC: 33.6 g/dL (ref 30.0–36.0)
MCV: 87.6 fL (ref 80.0–100.0)
Platelets: 225 10*3/uL (ref 150–400)
RBC: 4.52 MIL/uL (ref 3.87–5.11)
RDW: 13.6 % (ref 11.5–15.5)
WBC: 9 10*3/uL (ref 4.0–10.5)
nRBC: 0 % (ref 0.0–0.2)

## 2020-07-04 LAB — COMPREHENSIVE METABOLIC PANEL
ALT: 15 U/L (ref 0–44)
AST: 27 U/L (ref 15–41)
Albumin: 3.3 g/dL — ABNORMAL LOW (ref 3.5–5.0)
Alkaline Phosphatase: 54 U/L (ref 38–126)
Anion gap: 8 (ref 5–15)
BUN: 20 mg/dL (ref 8–23)
CO2: 22 mmol/L (ref 22–32)
Calcium: 9.1 mg/dL (ref 8.9–10.3)
Chloride: 108 mmol/L (ref 98–111)
Creatinine, Ser: 0.55 mg/dL (ref 0.44–1.00)
GFR, Estimated: >60 mL/min (ref >60)
Glucose, Bld: 86 mg/dL (ref 70–99)
Potassium: 3.6 mmol/L (ref 3.5–5.1)
Sodium: 138 mmol/L (ref 135–145)
Total Bilirubin: 0.7 mg/dL (ref 0.3–1.2)
Total Protein: 6.1 g/dL — ABNORMAL LOW (ref 6.5–8.1)

## 2020-07-04 LAB — PHOSPHORUS: Phosphorus: 2.5 mg/dL (ref 2.5–4.6)

## 2020-07-04 LAB — HEMOGLOBIN A1C
Hgb A1c MFr Bld: 5.6 % (ref 4.8–5.6)
Mean Plasma Glucose: 114 mg/dL

## 2020-07-04 LAB — LIPID PANEL
Cholesterol: 127 mg/dL (ref 0–200)
HDL: 31 mg/dL — ABNORMAL LOW (ref >40)
LDL Cholesterol: 77 mg/dL (ref 0–99)
Total CHOL/HDL Ratio: 4.1 RATIO
Triglycerides: 94 mg/dL (ref <150)
VLDL: 19 mg/dL (ref 0–40)

## 2020-07-04 LAB — MAGNESIUM: Magnesium: 2.2 mg/dL (ref 1.7–2.4)

## 2020-07-04 MED ORDER — ASPIRIN 81 MG PO TBEC: 81.0000 mg | DELAYED_RELEASE_TABLET | Freq: Every day | ORAL | 0 refills | Status: DC

## 2020-07-04 MED ORDER — NICOTINE 21 MG/24HR TD PT24: 21.0000 mg | MEDICATED_PATCH | Freq: Every day | TRANSDERMAL | 0 refills | Status: DC

## 2020-07-04 MED ORDER — ACETAMINOPHEN 325 MG PO TABS: 650.0000 mg | ORAL_TABLET | Freq: Four times a day (QID) | ORAL | Status: DC | PRN

## 2020-07-04 NOTE — Plan of Care (Signed)

## 2020-07-04 NOTE — TOC Transition Note (Signed)
Transition of Care [facility]) - CM/SW Discharge Note   Patient Details  Name: MALLERIE BLOK MRN: [id number] Date of Birth: Feb 12, 1941  Transition of Care [facility]) CM/SW Contact:  Allayne Butcher, RN Phone Number: 07/04/2020, 1:40 PM   Clinical Narrative:    Patient is medically cleared for discharge to [facility] for short term rehab.  Patient is going to room 607, bedside RN will call report to [phone].  [facility] will pick patient up for transport.  Patient is 3rd on the list for pick up.   Final next level of care: Skilled Nursing Facility Barriers to Discharge: Barriers Resolved   Patient Goals and CMS Choice Patient states their goals for this hospitalization and ongoing recovery are:: Patient would be willing to go to rehab if recommended CMS Medicare.gov Compare Post Acute Care list provided to:: Patient Choice offered to / list presented to : Patient  Discharge Placement PASRR number recieved: 07/03/20            Patient chooses bed at: [facility] Patient to be transferred to facility by: [facility] Name of family member notified: Louie Casa [place] (friend) and Viviano Simas (Brother) Patient and family notified of of transfer: 07/04/20  Discharge Plan and Services   Discharge Planning Services: CM Consult Post Acute Care Choice: Skilled Nursing Facility            DME Agency: NA                  Social Determinants of Health (SDOH) Interventions     Readmission Risk Interventions No flowsheet data found.

## 2020-07-04 NOTE — Progress Notes (Signed)
Reported given to [place] [facility]. No further questions. PIV removed.

## 2020-07-04 NOTE — Plan of Care (Signed)
  Problem: Education: Goal: Knowledge of General Education information will improve Description: Including pain rating scale, medication(s)/side effects and non-pharmacologic comfort measures 07/04/2020 1429 by Janan Halter, RN Outcome: Adequate for Discharge 07/04/2020 0802 by Janan Halter, RN Outcome: Progressing   Problem: Health Behavior/Discharge Planning: Goal: Ability to manage health-related needs will improve 07/04/2020 1429 by Janan Halter, RN Outcome: Adequate for Discharge 07/04/2020 0802 by Janan Halter, RN Outcome: Progressing   Problem: Clinical Measurements: Goal: Ability to maintain clinical measurements within normal limits will improve 07/04/2020 1429 by Janan Halter, RN Outcome: Adequate for Discharge 07/04/2020 0802 by Janan Halter, RN Outcome: Progressing Goal: Will remain free from infection 07/04/2020 1429 by Janan Halter, RN Outcome: Adequate for Discharge 07/04/2020 0802 by Janan Halter, RN Outcome: Progressing Goal: Diagnostic test results will improve 07/04/2020 1429 by Janan Halter, RN Outcome: Adequate for Discharge 07/04/2020 0802 by Janan Halter, RN Outcome: Progressing Goal: Respiratory complications will improve 07/04/2020 1429 by Janan Halter, RN Outcome: Adequate for Discharge 07/04/2020 0802 by Janan Halter, RN Outcome: Progressing Goal: Cardiovascular complication will be avoided 07/04/2020 1429 by Janan Halter, RN Outcome: Adequate for Discharge 07/04/2020 0802 by Janan Halter, RN Outcome: Progressing   Problem: Nutrition: Goal: Adequate nutrition will be maintained 07/04/2020 1429 by Janan Halter, RN Outcome: Adequate for Discharge 07/04/2020 0802 by Janan Halter, RN Outcome: Progressing   Problem: Coping: Goal: Level of anxiety will decrease 07/04/2020 1429 by Janan Halter, RN Outcome: Adequate for Discharge 07/04/2020 0802 by Janan Halter, RN Outcome: Progressing   Problem: Pain Managment: Goal:  General experience of comfort will improve 07/04/2020 1429 by Janan Halter, RN Outcome: Adequate for Discharge 07/04/2020 0802 by Janan Halter, RN Outcome: Progressing   Problem: Elimination: Goal: Will not experience complications related to bowel motility 07/04/2020 1429 by Janan Halter, RN Outcome: Adequate for Discharge 07/04/2020 0802 by Janan Halter, RN Outcome: Progressing Goal: Will not experience complications related to urinary retention 07/04/2020 1429 by Janan Halter, RN Outcome: Adequate for Discharge 07/04/2020 0802 by Janan Halter, RN Outcome: Progressing   Problem: Safety: Goal: Ability to remain free from injury will improve 07/04/2020 1429 by Janan Halter, RN Outcome: Adequate for Discharge 07/04/2020 0802 by Janan Halter, RN Outcome: Progressing   Problem: Skin Integrity: Goal: Risk for impaired skin integrity will decrease 07/04/2020 1429 by Janan Halter, RN Outcome: Adequate for Discharge 07/04/2020 0802 by Janan Halter, RN Outcome: Progressing

## 2020-07-04 NOTE — Discharge Summary (Addendum)
Discharge Summary  Donna Dodson:[id number] DOB: 12-Apr-1941  PCP: Patient, No Pcp Per  Admit date: 07/02/2020 Discharge date: 07/04/2020  Time spent: , more than 50% time spent on coordination of care.   Recommendations for Outpatient Follow-up:  1. F/u with SNF MD  for hospital discharge follow up, repeat cbc/bmp at follow up 2. -Follow-up on A1c result 3. May benefit from statin once recovered from rhabdomyolysis 4.   Recommend outpatient CT scan of the chest for possible right upper lung nodule seen on chest x-ray   Discharge Diagnoses:  Active Hospital Problems   Diagnosis Date Noted  . Rhabdomyolysis 03/19/2016  . History of stroke   . Fall at home, initial encounter   . Tobacco abuse   . Leukocytosis   . Lactic acidosis     Resolved Hospital Problems  No resolved problems to display.    Discharge Condition: stable  Diet recommendation: heart healthy  Filed Weights   07/02/20 1140  Weight: 78 kg    History of present illness: ( per admitting MD Dr Clyde Lundborg) PCP: Patient, No Pcp Per   Patient coming from:  The patient is coming from home.  At baseline, pt is partially dependent for most of ADL.        Chief Complaint: fall  HPI: Donna Dodson is a 79 y.o. female with medical history significant of stroke, depression, pacemaker placement, rhabdomyolysis, tobacco abuse, who presents with fall.  Patient states that rolled over the bed and fell on the floor at least 3 days ago. She states that she felt too weak to get up. She was found on the floor by her son today.The patient reports mild pain to the right shoulder.  No unilateral numbness or tingling extremities.  No facial droop or slurred speech.  Patient denies chest pain, shortness breath, cough, fever or chills.  No nausea vomiting, diarrhea, abdominal pain, symptoms of UTI.  ED Course: pt was found to have WBC 16.8, lactic acid 2.7, troponin 12, BNP 79, pending urinalysis, pending COVID-19 PCR,  CK 1022, renal function okay, temperature normal, blood pressure 128/99, heart rate 74, RR 18, oxygen saturation 98% on room air.  Chest x-ray is negative for infiltration, but showed questionable right upper lobe nodule.  CT of the head is negative for acute intracranial abnormalities.  CT of C-spine is negative for acute issues, but showed degenerative disc disease.  X-ray of right shoulder is negative for bony fracture.  Patient is placed on MedSurg bed for observation.  Hospital Course:  Principal Problem:   Rhabdomyolysis Active Problems:   History of stroke   Fall at home, initial encounter   Tobacco abuse   Leukocytosis   Lactic acidosis   Acute rhabdomyolysis: CK 1032 on presentation.  Likely secondary to fall.  CK level has improved with hydration, encourage oral intake.  Discharge to skilled nursing facility  Fall at home, initial encounter History of stroke in the past.  Physical therapy has seen the patient and recommended skilled nursing facility placement on discharge.  History of strokeNot taking medications at home.    Started on low-dose aspirin.    A1c in process at discharge  fasting lipid profile, HDL 31, LDL 77  Might benefit from statin once recovered from rhabdomyolysis  Right shoulder pain.  X-ray was negative except for suspected right upper lung nodule..  Will provide topical treatment and symptomatic care.  Warm compress.  Right upper lung nodule.  Suspected on the chest x-ray.  Will  need follow-up since patient is also a smoker.  Could consider CT scan of the chest outpatient  Tobacco abuse Counseling was done.  Patient has been started nicotine patch.  We will continue with that.  Leukocytosis: Likely reactive.  WBC 16.8 on presentation.  Flu and Covid test was negative.  Urinalysis showed some ketones and protein.  Rare bacteria but nitrite was negative.  No WBC in urine.  Patient was volume depleted.  Elevated lactate on presentation.  Likely  secondary to volume depletion.  No evidence of infection.  Lactic acid 2.7 on presentation.  This has improved with hydration  Suspected medication noncompliance.  Patient was supposed to multiple medications at home but this is now on it.  I did not see any aspirin on the medicine list as well.  Generalized weakness and debility deconditioning.  Patient was seen by physical therapy ,  Recommended skilled nursing facility placement.    DVT prophylaxis while in the hospital: enoxaparin (LOVENOX) injection 40 mg Start: 07/02/20 2200  Code Status: Partial code (ok with cpr but no intubation)  Family Communication: I tried to reach the patient's son on the phone number available but was unable to reach him. Number ([phone] number) listed is now with a business , I also tried home phone [phone] not able to complete the call.    Procedures:  none  Consultations:  none  Discharge Exam: BP 106/64 (BP Location: Right Arm)   Pulse (!) 59   Temp (!) 97.4 F (36.3 C) (Oral)   Resp 17   Ht 5\' 6"  (1.676 m)   Wt 78 kg   SpO2 100%   BMI 27.76 kg/m   General: frail, but NAD, pleasant, possible mild memory impairment Cardiovascular: RRR Respiratory: CTABL  Discharge Instructions You were cared for by a hospitalist during your hospital stay. If you have any questions about your discharge medications or the care you received while you were in the hospital after you are discharged, you can call the unit and asked to speak with the hospitalist on call if the hospitalist that took care of you is not available. Once you are discharged, your primary care physician will handle any further medical issues. Please note that NO REFILLS for any discharge medications will be authorized once you are discharged, as it is imperative that you return to your primary care physician (or establish a relationship with a primary care physician if you do not have one) for your aftercare needs so that they can  reassess your need for medications and monitor your lab values.  Discharge Instructions    Diet - low sodium heart healthy   Complete by: As directed    Increase activity slowly   Complete by: As directed      Allergies as of 07/04/2020      Reactions   Sulfa Antibiotics Rash      Medication List    STOP taking these medications   amoxicillin-clavulanate 875-125 MG tablet Commonly known as: AUGMENTIN   ibuprofen 400 MG tablet Commonly known as: ADVIL   ondansetron 4 MG disintegrating tablet Commonly known as: Zofran ODT   oxybutynin 10 MG 24 hr tablet Commonly known as: DITROPAN-XL     TAKE these medications   acetaminophen 325 MG tablet Commonly known as: TYLENOL Take 2 tablets (650 mg total) by mouth every 6 (six) hours as needed for fever, headache or moderate pain. What changed: reasons to take this   aspirin 81 MG EC tablet Take 1  tablet (81 mg total) by mouth daily. Swallow whole. Start taking on: July 05, 2020   feeding supplement Liqd Take 237 mLs by mouth 2 (two) times daily between meals.   nicotine 21 mg/24hr patch Commonly known as: NICODERM CQ - dosed in mg/24 hours Place 1 patch (21 mg total) onto the skin daily. Start taking on: July 05, 2020      Allergies  Allergen Reactions  . Sulfa Antibiotics Rash    Contact information for after-discharge care    Destination    [facility] Preferred SNF .   Service: Skilled Nursing Contact information: [address] [place] [address] [phone]                   The results of significant diagnostics from this hospitalization (including imaging, microbiology, ancillary and laboratory) are listed below for reference.    Significant Diagnostic Studies: DG Shoulder Right  Result Date: 07/02/2020 CLINICAL DATA:  Larey Seat out of bed and landed on right shoulder. EXAM: RIGHT SHOULDER - 2+ VIEW COMPARISON:  None. FINDINGS: The [facility] joint and glenohumeral  joints are maintained. No acute fracture is identified. The visualized right ribs are intact and the visualized right lung is grossly clear. IMPRESSION: No acute bony findings. Electronically Signed   By: Rudie Meyer M.D.   On: 07/02/2020 13:37   CT Head Wo Contrast  Result Date: 07/02/2020 CLINICAL DATA:  Unwitnessed fall. EXAM: CT HEAD WITHOUT CONTRAST CT CERVICAL SPINE WITHOUT CONTRAST TECHNIQUE: Multidetector CT imaging of the head and cervical spine was performed following the standard protocol without intravenous contrast. Multiplanar CT image reconstructions of the cervical spine were also generated. COMPARISON:  April 17, 2018. FINDINGS: CT HEAD FINDINGS Brain: Mild chronic ischemic white matter disease is noted. Mild diffuse cortical atrophy is noted. No mass effect or midline shift is noted. Stable ventricular dilatation is noted most likely due to surrounding atrophy. There is no evidence of mass lesion, hemorrhage or acute infarction. Vascular: No hyperdense vessel or unexpected calcification. Skull: Normal. Negative for fracture or focal lesion. Sinuses/Orbits: No acute finding. Other: None. CT CERVICAL SPINE FINDINGS Alignment: Mild grade 1 anterolisthesis of C4-5 is noted secondary to posterior facet joint hypertrophy. Skull base and vertebrae: No acute fracture. No primary bone lesion or focal pathologic process. Soft tissues and spinal canal: No prevertebral fluid or swelling. No visible canal hematoma. Disc levels: Severe degenerative disc disease is noted at C5-6 and C6-7. Upper chest: Negative. Other: Degenerative changes are seen involving posterior facet joints bilaterally. IMPRESSION: 1. Mild chronic ischemic white matter disease. Mild diffuse cortical atrophy. No acute intracranial abnormality seen. 2. Severe multilevel degenerative disc disease. No acute abnormality seen in the cervical spine. Electronically Signed   By: Lupita Raider M.D.   On: 07/02/2020 13:17   CT Cervical  Spine Wo Contrast  Result Date: 07/02/2020 CLINICAL DATA:  Unwitnessed fall. EXAM: CT HEAD WITHOUT CONTRAST CT CERVICAL SPINE WITHOUT CONTRAST TECHNIQUE: Multidetector CT imaging of the head and cervical spine was performed following the standard protocol without intravenous contrast. Multiplanar CT image reconstructions of the cervical spine were also generated. COMPARISON:  April 17, 2018. FINDINGS: CT HEAD FINDINGS Brain: Mild chronic ischemic white matter disease is noted. Mild diffuse cortical atrophy is noted. No mass effect or midline shift is noted. Stable ventricular dilatation is noted most likely due to surrounding atrophy. There is no evidence of mass lesion, hemorrhage or acute infarction. Vascular: No hyperdense vessel or unexpected calcification.  Skull: Normal. Negative for fracture or focal lesion. Sinuses/Orbits: No acute finding. Other: None. CT CERVICAL SPINE FINDINGS Alignment: Mild grade 1 anterolisthesis of C4-5 is noted secondary to posterior facet joint hypertrophy. Skull base and vertebrae: No acute fracture. No primary bone lesion or focal pathologic process. Soft tissues and spinal canal: No prevertebral fluid or swelling. No visible canal hematoma. Disc levels: Severe degenerative disc disease is noted at C5-6 and C6-7. Upper chest: Negative. Other: Degenerative changes are seen involving posterior facet joints bilaterally. IMPRESSION: 1. Mild chronic ischemic white matter disease. Mild diffuse cortical atrophy. No acute intracranial abnormality seen. 2. Severe multilevel degenerative disc disease. No acute abnormality seen in the cervical spine. Electronically Signed   By: Lupita Raider M.D.   On: 07/02/2020 13:17   DG Chest Portable 1 View  Result Date: 07/02/2020 CLINICAL DATA:  Larey Seat.  Right-sided chest pain. EXAM: PORTABLE CHEST 1 VIEW COMPARISON:  03/19/2016 FINDINGS: The cardiac silhouette, mediastinal and hilar contours are within normal limits and stable. The lungs are  clear. No pneumothorax or pleural effusion. Right upper lobe lung nodule is suspected. I do not see this on the prior chest x-ray. Recommend chest CT for further evaluation. No acute bony findings. IMPRESSION: 1. No acute cardiopulmonary findings. 2. Suspect right upper lobe lung nodule. Recommend chest CT for further evaluation. Electronically Signed   By: Rudie Meyer M.D.   On: 07/02/2020 13:41    Microbiology: Recent Results (from the past 240 hour(s))  Respiratory Panel by RT PCR (Flu A&B, Covid) - Nasopharyngeal Swab     Status: None   Collection Time: 07/02/20  3:43 PM   Specimen: Nasopharyngeal Swab  Result Value Ref Range Status   SARS Coronavirus 2 by RT PCR NEGATIVE NEGATIVE Final    Comment: (NOTE) SARS-CoV-2 target nucleic acids are NOT DETECTED.  The SARS-CoV-2 RNA is generally detectable in upper respiratoy specimens during the acute phase of infection. The lowest concentration of SARS-CoV-2 viral copies this assay can detect is 131 copies/mL. A negative result does not preclude SARS-Cov-2 infection and should not be used as the sole basis for treatment or other patient management decisions. A negative result may occur with  improper specimen collection/handling, submission of specimen other than nasopharyngeal swab, presence of viral mutation(s) within the areas targeted by this assay, and inadequate number of viral copies (<131 copies/mL). A negative result must be combined with clinical observations, patient history, and epidemiological information. The expected result is Negative.  Fact Sheet for Patients:  https://www.moore.com/  Fact Sheet for Healthcare Providers:  https://www.young.biz/  This test is no t yet approved or cleared by the [place] FDA and  has been authorized for detection and/or diagnosis of SARS-CoV-2 by FDA under an Emergency Use Authorization (EUA). This EUA will remain  in effect (meaning this  test can be used) for the duration of the COVID-19 declaration under Section 564(b)(1) of the Act, 21 U.S.C. section 360bbb-3(b)(1), unless the authorization is terminated or revoked sooner.     Influenza A by PCR NEGATIVE NEGATIVE Final   Influenza B by PCR NEGATIVE NEGATIVE Final    Comment: (NOTE) The Xpert Xpress SARS-CoV-2/FLU/RSV assay is intended as an aid in  the diagnosis of influenza from Nasopharyngeal swab specimens and  should not be used as a sole basis for treatment. Nasal washings and  aspirates are unacceptable for Xpert Xpress SARS-CoV-2/FLU/RSV  testing.  Fact Sheet for Patients: https://www.moore.com/  Fact Sheet for Healthcare Providers: https://www.young.biz/  This test is  not yet approved or cleared by the [place] and  has been authorized for detection and/or diagnosis of SARS-CoV-2 by  FDA under an Emergency Use Authorization (EUA). This EUA will remain  in effect (meaning this test can be used) for the duration of the  Covid-19 declaration under Section 564(b)(1) of the Act, 21  U.S.C. section 360bbb-3(b)(1), unless the authorization is  terminated or revoked. Performed at [facility], [address] [place], [place] [address]      Labs: Basic Metabolic Panel: Recent Labs  Lab 07/02/20 1216 07/03/20 0528 07/04/20 0442  NA 138 135 138  K 4.1 3.7 3.6  CL 104 108 108  CO2 16* 20* 22  GLUCOSE 143* 100* 86  BUN 41* 33* 20  CREATININE 1.00 0.71 0.55  CALCIUM 10.1 9.1 9.1  MG  --   --  2.2  PHOS  --   --  2.5   Liver Function Tests: Recent Labs  Lab 07/02/20 1216 07/04/20 0442  AST 40 27  ALT 18 15  ALKPHOS 85 54  BILITOT 1.5* 0.7  PROT 8.8* 6.1*  ALBUMIN 4.6 3.3*   No results for input(s): LIPASE, AMYLASE in the last 168 hours. No results for input(s): AMMONIA in the last 168 hours. CBC: Recent Labs  Lab 07/02/20 1216 07/03/20 0528 07/04/20 0442  WBC 16.8* 9.4 9.0   NEUTROABS 14.7*  --   --   HGB 16.7* 14.0 13.3  HCT 49.5* 41.0 39.6  MCV 87.3 86.3 87.6  PLT 326 247 225   Cardiac Enzymes: Recent Labs  Lab 07/02/20 1216 07/03/20 0528  CKTOTAL 1,032* 547*   BNP: BNP (last 3 results) Recent Labs    07/02/20 1218  BNP 79.7    ProBNP (last 3 results) No results for input(s): PROBNP in the last 8760 hours.  CBG: No results for input(s): GLUCAP in the last 168 hours.     Signed:  Albertine Grates MD, PhD, FACP  [facility] 07/04/2020, 1:09 PM

## 2020-08-06 ENCOUNTER — Emergency Department: Payer: Medicare Other

## 2020-08-06 ENCOUNTER — Other Ambulatory Visit: Payer: Self-pay

## 2020-08-06 ENCOUNTER — Encounter: Payer: Self-pay | Admitting: Emergency Medicine

## 2020-08-06 ENCOUNTER — Inpatient Hospital Stay
Admission: EM | Admit: 2020-08-06 | Discharge: 2020-08-14 | DRG: 948 | Disposition: A | Discharge status: Skilled Nursing Facility | Payer: Medicare Other | Attending: Internal Medicine | Admitting: Internal Medicine

## 2020-08-06 DIAGNOSIS — Z720 Tobacco use: Secondary | ICD-10-CM | POA: Diagnosis present

## 2020-08-06 DIAGNOSIS — L8990 Pressure ulcer of unspecified site, unspecified stage: Secondary | ICD-10-CM | POA: Insufficient documentation

## 2020-08-06 DIAGNOSIS — Z8673 Personal history of transient ischemic attack (TIA), and cerebral infarction without residual deficits: Secondary | ICD-10-CM

## 2020-08-06 DIAGNOSIS — R531 Weakness: Secondary | ICD-10-CM | POA: Diagnosis not present

## 2020-08-06 DIAGNOSIS — Z950 Presence of cardiac pacemaker: Secondary | ICD-10-CM | POA: Diagnosis not present

## 2020-08-06 DIAGNOSIS — L89322 Pressure ulcer of left buttock, stage 2: Secondary | ICD-10-CM | POA: Diagnosis present

## 2020-08-06 DIAGNOSIS — I10 Essential (primary) hypertension: Secondary | ICD-10-CM | POA: Diagnosis present

## 2020-08-06 DIAGNOSIS — Z882 Allergy status to sulfonamides status: Secondary | ICD-10-CM

## 2020-08-06 DIAGNOSIS — F0390 Unspecified dementia without behavioral disturbance: Secondary | ICD-10-CM | POA: Diagnosis present

## 2020-08-06 DIAGNOSIS — Z66 Do not resuscitate: Secondary | ICD-10-CM | POA: Diagnosis not present

## 2020-08-06 DIAGNOSIS — R262 Difficulty in walking, not elsewhere classified: Secondary | ICD-10-CM | POA: Diagnosis not present

## 2020-08-06 DIAGNOSIS — G459 Transient cerebral ischemic attack, unspecified: Secondary | ICD-10-CM

## 2020-08-06 DIAGNOSIS — R29898 Other symptoms and signs involving the musculoskeletal system: Secondary | ICD-10-CM

## 2020-08-06 DIAGNOSIS — I495 Sick sinus syndrome: Secondary | ICD-10-CM | POA: Diagnosis present

## 2020-08-06 DIAGNOSIS — Z79899 Other long term (current) drug therapy: Secondary | ICD-10-CM

## 2020-08-06 DIAGNOSIS — Z7982 Long term (current) use of aspirin: Secondary | ICD-10-CM

## 2020-08-06 DIAGNOSIS — Z20822 Contact with and (suspected) exposure to covid-19: Secondary | ICD-10-CM | POA: Diagnosis present

## 2020-08-06 DIAGNOSIS — F17210 Nicotine dependence, cigarettes, uncomplicated: Secondary | ICD-10-CM | POA: Diagnosis present

## 2020-08-06 LAB — CBC WITH DIFFERENTIAL/PLATELET
Abs Immature Granulocytes: 0.03 10*3/uL (ref 0.00–0.07)
Basophils Absolute: 0 10*3/uL (ref 0.0–0.1)
Basophils Relative: 0 %
Eosinophils Absolute: 0.1 10*3/uL (ref 0.0–0.5)
Eosinophils Relative: 1 %
HCT: 42.6 % (ref 36.0–46.0)
Hemoglobin: 14.2 g/dL (ref 12.0–15.0)
Immature Granulocytes: 0 %
Lymphocytes Relative: 10 %
Lymphs Abs: 1.1 10*3/uL (ref 0.7–4.0)
MCH: 29.8 pg (ref 26.0–34.0)
MCHC: 33.3 g/dL (ref 30.0–36.0)
MCV: 89.5 fL (ref 80.0–100.0)
Monocytes Absolute: 0.5 10*3/uL (ref 0.1–1.0)
Monocytes Relative: 4 %
Neutro Abs: 9.9 10*3/uL — ABNORMAL HIGH (ref 1.7–7.7)
Neutrophils Relative %: 85 %
Platelets: 285 10*3/uL (ref 150–400)
RBC: 4.76 MIL/uL (ref 3.87–5.11)
RDW: 14.6 % (ref 11.5–15.5)
WBC: 11.6 10*3/uL — ABNORMAL HIGH (ref 4.0–10.5)
nRBC: 0 % (ref 0.0–0.2)

## 2020-08-06 LAB — URINALYSIS, COMPLETE (UACMP) WITH MICROSCOPIC
Bilirubin Urine: NEGATIVE
Glucose, UA: NEGATIVE mg/dL
Hgb urine dipstick: NEGATIVE
Ketones, ur: 80 mg/dL — AB
Leukocytes,Ua: NEGATIVE
Nitrite: NEGATIVE
Protein, ur: NEGATIVE mg/dL
Specific Gravity, Urine: 1.017 (ref 1.005–1.030)
pH: 5 (ref 5.0–8.0)

## 2020-08-06 LAB — COMPREHENSIVE METABOLIC PANEL
ALT: 14 U/L (ref 0–44)
AST: 25 U/L (ref 15–41)
Albumin: 3.9 g/dL (ref 3.5–5.0)
Alkaline Phosphatase: 71 U/L (ref 38–126)
Anion gap: 11 (ref 5–15)
BUN: 10 mg/dL (ref 8–23)
CO2: 23 mmol/L (ref 22–32)
Calcium: 9.2 mg/dL (ref 8.9–10.3)
Chloride: 104 mmol/L (ref 98–111)
Creatinine, Ser: 0.67 mg/dL (ref 0.44–1.00)
GFR, Estimated: >60 mL/min (ref >60)
Glucose, Bld: 88 mg/dL (ref 70–99)
Potassium: 4 mmol/L (ref 3.5–5.1)
Sodium: 138 mmol/L (ref 135–145)
Total Bilirubin: 0.7 mg/dL (ref 0.3–1.2)
Total Protein: 7.1 g/dL (ref 6.5–8.1)

## 2020-08-06 LAB — CK: Total CK: 112 U/L (ref 38–234)

## 2020-08-06 LAB — PROTIME-INR
INR: 1 (ref 0.8–1.2)
Prothrombin Time: 12.9 seconds (ref 11.4–15.2)

## 2020-08-06 LAB — APTT: aPTT: 24 seconds (ref 24–36)

## 2020-08-06 LAB — LACTIC ACID, PLASMA
Lactic Acid, Venous: 1.5 mmol/L (ref 0.5–1.9)
Lactic Acid, Venous: 1 mmol/L (ref 0.5–1.9)

## 2020-08-06 LAB — RESP PANEL BY RT-PCR (FLU A&B, COVID) ARPGX2
Influenza A by PCR: NEGATIVE
Influenza B by PCR: NEGATIVE
SARS Coronavirus 2 by RT PCR: NEGATIVE

## 2020-08-06 LAB — TROPONIN I (HIGH SENSITIVITY)
Troponin I (High Sensitivity): 6 ng/L (ref <18)
Troponin I (High Sensitivity): 6 ng/L (ref <18)

## 2020-08-06 LAB — ETHANOL: Alcohol, Ethyl (B): <10 mg/dL (ref <10)

## 2020-08-06 MED ORDER — LACTATED RINGERS IV BOLUS
1000.0000 mL | Freq: Once | INTRAVENOUS | Status: AC
  Administered 2020-08-06: 1000 mL via INTRAVENOUS

## 2020-08-06 MED ORDER — ONDANSETRON HCL 4 MG PO TABS: 4.0000 mg | ORAL_TABLET | Freq: Four times a day (QID) | ORAL | Status: DC | PRN

## 2020-08-06 MED ORDER — ACETAMINOPHEN 325 MG PO TABS: 650.0000 mg | ORAL_TABLET | Freq: Four times a day (QID) | ORAL | Status: DC | PRN

## 2020-08-06 MED ORDER — ENOXAPARIN SODIUM 40 MG/0.4ML ~~LOC~~ SOLN
40.0000 mg | SUBCUTANEOUS | Status: DC
  Administered 2020-08-07 – 2020-08-14 (×8): 40 mg via SUBCUTANEOUS
  Filled 2020-08-06 (×8): qty 0.4

## 2020-08-06 MED ORDER — NICOTINE 14 MG/24HR TD PT24
14.0000 mg | MEDICATED_PATCH | Freq: Every day | TRANSDERMAL | Status: DC
  Administered 2020-08-07 – 2020-08-14 (×8): 14 mg via TRANSDERMAL
  Filled 2020-08-06 (×8): qty 1

## 2020-08-06 MED ORDER — ACETAMINOPHEN 650 MG RE SUPP: 650.0000 mg | Freq: Four times a day (QID) | RECTAL | Status: DC | PRN

## 2020-08-06 MED ORDER — ASPIRIN EC 81 MG PO TBEC
81.0000 mg | DELAYED_RELEASE_TABLET | Freq: Every day | ORAL | Status: DC
  Administered 2020-08-07 – 2020-08-14 (×8): 81 mg via ORAL
  Filled 2020-08-06 (×8): qty 1

## 2020-08-06 MED ORDER — ONDANSETRON HCL 4 MG/2ML IJ SOLN: 4.0000 mg | Freq: Four times a day (QID) | INTRAMUSCULAR | Status: DC | PRN

## 2020-08-06 NOTE — H&P (Signed)
History and Physical    Donna Dodson JQZ:[id number] DOB: 1941-07-01 DOA: 08/06/2020  PCP: Patient, No Pcp Per   Patient coming from: Home  I have personally briefly reviewed patient's old medical records in [facility]  CC: left leg weakness HPI: 79 yo WF with hx of PPM due to SSS, chronic tobacco abuse, presents to ER via EMS. Pt states pt was about 2 days she was sitting on her couch unable to get up.  Reportedly per EMS, pt was found on couch in her own feces and urine. ER provider stated that pt's son had found pt and called EMS. Pt lives alone. Pt states her son used to live with her but has since moved out. Pt denies any F/C/N/V. No headaches. No chest pain or SOB. Pt states her left leg is weak and was unable to stand up from her couch. Pt did not have her cell phone near her.  Workup in ER unremarkable. CT head negative. Pt has a PPM. Unclear if it is MRI compatible.  Due to pt's leg weakness and unsafe discharge plan, [facility] hospitalist contacted for admission.    ED Course: pt seen in ER. CT head negative for acute CVA. No safe discharge plan as pt lives alone.  Review of Systems:  Review of Systems  Constitutional: Negative for chills, fever and weight loss.  HENT: Negative.   Eyes: Negative.   Respiratory: Negative.  Negative for cough, hemoptysis and sputum production.   Cardiovascular: Negative.  Negative for chest pain.  Gastrointestinal: Negative.  Negative for abdominal pain, nausea and vomiting.  Genitourinary: Negative.  Negative for dysuria and urgency.  Musculoskeletal: Negative.  Negative for back pain and neck pain.  Skin: Negative.   Neurological: Positive for focal weakness.       Left leg weak for 2 days. No pain. No falls.  Endo/Heme/Allergies: Negative.   Psychiatric/Behavioral: Negative.   All other systems reviewed and are negative.   Past Medical History:  Diagnosis Date  . Depressed   . Pacemaker   . Stroke [facility])     Past Surgical  History:  Procedure Laterality Date  . PACEMAKER PLACEMENT       reports that she has been smoking. She has been smoking about 0.50 packs per day. She has never used smokeless tobacco. She reports that she does not drink alcohol and does not use drugs.  Allergies  Allergen Reactions  . Sulfa Antibiotics Rash    Family History  Family history unknown: Yes    Prior to Admission medications   Not on File    Physical Exam: Vitals:   08/06/20 1800 08/06/20 1830 08/06/20 1900 08/06/20 2000  BP: 121/78 114/80 114/90 (!) 143/109  Pulse: 61 (!) 59 62 68  Resp: 20 19 (!) 25 14  Temp:      TempSrc:      SpO2: 100% 100% 98% 100%  Weight:        Physical Exam Vitals and nursing note reviewed.  Constitutional:      General: She is not in acute distress.    Appearance: She is normal weight. She is not ill-appearing, toxic-appearing or diaphoretic.     Comments: Appears chronically ill  HENT:     Head: Normocephalic and atraumatic.     Nose: Nose normal. No rhinorrhea.  Eyes:     General:        Right eye: No discharge.        Left eye: No discharge.  Cardiovascular:     Rate and Rhythm: Normal rate and regular rhythm.     Pulses: Normal pulses.  Pulmonary:     Effort: Pulmonary effort is normal. No respiratory distress.     Breath sounds: Normal breath sounds. No wheezing or rales.  Abdominal:     General: Abdomen is flat. Bowel sounds are normal. There is no distension.     Tenderness: There is no abdominal tenderness. There is no guarding.  Musculoskeletal:     Right lower leg: No edema.     Left lower leg: No edema.  Skin:    General: Skin is warm and dry.     Capillary Refill: Capillary refill takes less than 2 seconds.     Findings: Lesion present.     Comments: Excoriation on bilateral lower extremities  Neurological:     General: No focal deficit present.     Mental Status: She is alert and oriented to person, place, and time.      Labs on Admission: I have  personally reviewed following labs and imaging studies  CBC: Recent Labs  Lab 08/06/20 1447  WBC 11.6*  NEUTROABS 9.9*  HGB 14.2  HCT 42.6  MCV 89.5  PLT 285   Basic Metabolic Panel: Recent Labs  Lab 08/06/20 1447  NA 138  K 4.0  CL 104  CO2 23  GLUCOSE 88  BUN 10  CREATININE 0.67  CALCIUM 9.2   GFR: Estimated Creatinine Clearance: 62.1 mL/min (by C-G formula based on SCr of 0.67 mg/dL). Liver Function Tests: Recent Labs  Lab 08/06/20 1447  AST 25  ALT 14  ALKPHOS 71  BILITOT 0.7  PROT 7.1  ALBUMIN 3.9   No results for input(s): LIPASE, AMYLASE in the last 168 hours. No results for input(s): AMMONIA in the last 168 hours. Coagulation Profile: Recent Labs  Lab 08/06/20 1805  INR 1.0   Cardiac Enzymes: Recent Labs  Lab 08/06/20 1447  CKTOTAL 112   BNP (last 3 results) No results for input(s): PROBNP in the last 8760 hours. HbA1C: No results for input(s): HGBA1C in the last 72 hours. CBG: No results for input(s): GLUCAP in the last 168 hours. Lipid Profile: No results for input(s): CHOL, HDL, LDLCALC, TRIG, CHOLHDL, LDLDIRECT in the last 72 hours. Thyroid Function Tests: No results for input(s): TSH, T4TOTAL, FREET4, T3FREE, THYROIDAB in the last 72 hours. Anemia Panel: No results for input(s): VITAMINB12, FOLATE, FERRITIN, TIBC, IRON, RETICCTPCT in the last 72 hours. Urine analysis:    Component Value Date/Time   COLORURINE YELLOW (A) 08/06/2020 1446   APPEARANCEUR CLEAR (A) 08/06/2020 1446   APPEARANCEUR Clear 01/13/2013 1657   LABSPEC 1.017 08/06/2020 1446   LABSPEC 1.006 01/13/2013 1657   PHURINE 5.0 08/06/2020 1446   GLUCOSEU NEGATIVE 08/06/2020 1446   GLUCOSEU Negative 01/13/2013 1657   HGBUR NEGATIVE 08/06/2020 1446   BILIRUBINUR NEGATIVE 08/06/2020 1446   BILIRUBINUR Negative 01/13/2013 1657   KETONESUR 80 (A) 08/06/2020 1446   PROTEINUR NEGATIVE 08/06/2020 1446   NITRITE NEGATIVE 08/06/2020 1446   LEUKOCYTESUR NEGATIVE 08/06/2020  1446   LEUKOCYTESUR Negative 01/13/2013 1657    Radiological Exams on Admission: I have personally reviewed images DG Chest 2 View  Result Date: 08/06/2020 CLINICAL DATA:  Weakness. EXAM: CHEST - 2 VIEW COMPARISON:  July 02, 2020. FINDINGS: The heart size and mediastinal contours are within normal limits. Similar positioning of left subclavian approach cardiac rhythm maintenance device. No consolidation. Suspected right upper lung pulmonary nodule. No visible pleural  effusions or pneumothorax. No acute osseous abnormality. Left shoulder degenerative change. IMPRESSION: 1. No acute cardiopulmonary disease. 2. Suspected right upper lung pulmonary nodule. Recommend chest CT for further evaluation. Electronically Signed   By: Feliberto Harts MD   On: 08/06/2020 15:09   CT Head Wo Contrast  Result Date: 08/06/2020 CLINICAL DATA:  Altered mental status. Left lower extremity weakness EXAM: CT HEAD WITHOUT CONTRAST TECHNIQUE: Contiguous axial images were obtained from the base of the skull through the vertex without intravenous contrast. COMPARISON:  July 02, 2020 and April 17, 2018 FINDINGS: Brain: Diffuse atrophy is stable compared to prior studies. No intracranial mass, hemorrhage, extra-axial fluid collection, or midline shift. Patchy small vessel disease throughout the centra semiovale bilaterally appear stable. No acute infarct is evident. Small vessel disease throughout the anterior limb of the right external capsule noted, stable. Vascular: No hyperdense vessel. No appreciable vascular calcification. Skull: Bony calvarium appears intact. Sinuses/Orbits: Visualized paranasal sinuses are clear. Visualized orbits appear symmetric bilaterally. Other: Visualized mastoid air cells are clear. IMPRESSION: Diffuse atrophy is stable. There is stable small vessel disease in the centra semiovale bilaterally. Small vessel disease in the anterior limb of the right external capsule is likewise stable. No  acute infarct. No mass or hemorrhage. Electronically Signed   By: Bretta Bang III M.D.   On: 08/06/2020 15:15    EKG: I have personally reviewed EKG: atrial paced   79 yo WF admitted for inability to walk, TIA workup(if possible due to pacemaker)  Assessment/Plan Principal Problem:   Unable to walk Active Problems:   Tobacco abuse   S/P placement of cardiac pacemaker - [employer] Adapta [id number], serial # [id number]   DNR (do not resuscitate)    Unable to walk Admit to observation. Will need social worker's assistance in helping pt find alternative living arrangement. Pt cannot live by herself anymore.  S/P placement of cardiac pacemaker - Med[employer] Adapta [id number], serial # [id number] Unclear if her pacemaker is MRI compatible.  May need to contact [employer] rep to find out.  If pacemaker is MRI and leads are MRI compatible, pt may need MRI brain to rule out CVA as the cause of her leg weakness. Given that it's been > 48 hours since the onset of this weakness, pt is out of the window for any aggressive treatments.  Tobacco abuse Pt still smoking cigarettes.  DNR (do not resuscitate) Verified DNR/DNI status with patient.   DVT prophylaxis: Lovenox Code Status: DNR/DNI(Do NOT Intubate) verified DNR/DNI status with patient. Family Communication: no family at bedside  Disposition Plan: SW consult to assist with DC planning(home vs ALF vs SNF)  Consults called: none  Admission status: Observation, Med-Surg   Carollee Herter, DO [facility] 08/06/2020, 8:41 PM

## 2020-08-06 NOTE — Assessment & Plan Note (Signed)
Unclear if her pacemaker is MRI compatible.  May need to contact [employer] rep to find out.  If pacemaker is MRI and leads are MRI compatible, pt may need MRI brain to rule out CVA as the cause of her leg weakness. Given that it's been > 48 hours since the onset of this weakness, pt is out of the window for any aggressive treatments.

## 2020-08-06 NOTE — ED Provider Notes (Signed)
Lactic acid 1.5 CK 112 CBC wbc 11.6, hgb 14.2, plt 285 Trop hs 6 CMP wnl   CT head No acute infarct  CXR No acute abnormality. Likely pulmonary nodule in right upper lobe  Patient's work up without clear etiology of the weakness. Will plan on admission for further work up and evaluation.    Phineas Semen, MD 08/06/20 [phone]

## 2020-08-06 NOTE — ED Notes (Signed)
Patient transported to CT 

## 2020-08-06 NOTE — ED Notes (Signed)
Took over care of pt. Pt returned from CT. Pt in NAD at this time. VSS. Awaiting further orders. Will continue to monitor.

## 2020-08-06 NOTE — Subjective & Objective (Signed)
CC: left leg weakness HPI: 79 yo WF with hx of PPM due to SSS, chronic tobacco abuse, presents to ER via EMS. Pt states pt was about 2 days she was sitting on her couch unable to get up.  Reportedly per EMS, pt was found on couch in her own feces and urine. ER provider stated that pt's son had found pt and called EMS. Pt lives alone. Pt states her son used to live with her but has since moved out. Pt denies any F/C/N/V. No headaches. No chest pain or SOB. Pt states her left leg is weak and was unable to stand up from her couch. Pt did not have her cell phone near her.  Workup in ER unremarkable. CT head negative. Pt has a PPM. Unclear if it is MRI compatible.  Due to pt's leg weakness and unsafe discharge plan, [facility] hospitalist contacted for admission.

## 2020-08-06 NOTE — Assessment & Plan Note (Signed)
Pt still smoking cigarettes.

## 2020-08-06 NOTE — Assessment & Plan Note (Signed)
Admit to observation. Will need social worker's assistance in helping pt find alternative living arrangement. Pt cannot live by herself anymore.

## 2020-08-06 NOTE — ED Triage Notes (Signed)
Pt in from home via [facility] with c/o L leg weakness (inability to stand) x 2 days. EMS found her sitting on the couch, covered in urine and feces. Lives by self, son comes to check on her occasionally, unknown LSN. C/o L hip pain. Equal grip and strength bilaterally on neuro exam.

## 2020-08-06 NOTE — ED Provider Notes (Signed)
[facility] Emergency Department Provider Note   ____________________________________________   First MD Initiated Contact with Patient 08/06/20 1432     (approximate)  I have reviewed the triage vital signs and the nursing notes.   HISTORY  Chief Complaint Weakness    HPI Donna Dodson is a 79 y.o. female with past medical history of stroke, hypertension, and pacemaker who presents to the ED for weakness.  Patient reports that for the past 2 days when she tries to move her left leg that it "does not work."  She states that leg feels weak but she denies any associated numbness.  She states she has been unable to walk on the leg and as a result has had a hard time getting around her home.  She lives alone and has been unable to get up to go to the bathroom, EMS reports she has been sitting on her couch, urinating and defecating on herself, for the past couple of days.  Patient denies any pain in her leg and has not had any falls.  She denies any numbness or weakness affecting other extremities.  She has not had any fevers, cough, chest pain, shortness of breath, dysuria, or hematuria.        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 Authorizing Provider  acetaminophen (TYLENOL) 325 MG tablet Take 2 tablets (650 mg total) by mouth every 6 (six) hours as needed for fever, headache or moderate pain. 07/04/20   Albertine Grates, MD  aspirin EC 81 MG EC tablet Take 1 tablet (81 mg total) by mouth daily. Swallow whole. 07/05/20   Albertine Grates, MD  feeding supplement, ENSURE ENLIVE, (ENSURE ENLIVE) LIQD Take 237 mLs by mouth 2 (two) times daily between meals. Patient not taking: Reported on 07/02/2020 04/11/16   Enid Baas, MD  nicotine (NICODERM CQ - DOSED IN MG/24 HOURS) 21 mg/24hr patch Place 1 patch (21 mg total) onto the skin daily. 07/05/20   Albertine Grates, MD    Allergies Sulfa antibiotics  Family History  Family history unknown: Yes    Social History Social History   Tobacco Use   Smoking status: Current Every Day Smoker    Packs/day: 0.50   Smokeless tobacco: Never Used  Substance Use Topics   Alcohol use: No   Drug use: Never    Review of Systems  Constitutional: No fever/chills Eyes: No visual changes. ENT: No sore throat. Cardiovascular: Denies chest pain. Respiratory: Denies shortness of breath. Gastrointestinal: No abdominal pain.  No nausea, no vomiting.  No diarrhea.  No constipation. Genitourinary: Negative for dysuria. Musculoskeletal: Negative for back pain. Skin: Negative for rash. Neurological: Negative for headaches, positive for left leg weakness.  ____________________________________________   PHYSICAL EXAM:  VITAL SIGNS: ED Triage Vitals  Enc Vitals Group     BP      Pulse      Resp      Temp  Temp src      SpO2      Weight      Height      Head Circumference      Peak Flow      Pain Score      Pain Loc      Pain Edu?      Excl. in GC?     Constitutional: Alert and oriented. Eyes: Conjunctivae are normal. Head: Atraumatic. Nose: No congestion/rhinnorhea. Mouth/Throat: Mucous membranes are moist. Neck: Normal ROM Cardiovascular: Normal rate, regular rhythm. Grossly normal heart sounds. Respiratory: Normal respiratory effort.  No retractions. Lungs CTAB. Gastrointestinal: Soft and nontender. No distention. Genitourinary: deferred Musculoskeletal: No lower extremity tenderness nor edema. Neurologic:  Normal speech and language.  5 out of 5 strength  in bilateral upper extremities.  4-5 strength in left lower extremity, 5 out of 5 strength in right lower extremity. Skin:  Skin is warm, dry and intact. No rash noted. Psychiatric: Mood and affect are normal. Speech and behavior are normal.  ____________________________________________   LABS (all labs ordered are listed, but only abnormal results are displayed)  Labs Reviewed  RESP PANEL BY RT-PCR (FLU A&B, COVID) [id number]  CBC WITH DIFFERENTIAL/PLATELET  COMPREHENSIVE METABOLIC PANEL  URINALYSIS, COMPLETE (UACMP) WITH MICROSCOPIC  CK  LACTIC ACID, PLASMA  LACTIC ACID, PLASMA  ETHANOL  PROTIME-INR  APTT  TROPONIN I (HIGH SENSITIVITY)   ____________________________________________  EKG  ED ECG REPORT I, Chesley Noon, the attending physician, personally viewed and interpreted this ECG.   Date: 08/06/2020  EKG Time: 14:37  Rate: 79  Rhythm: Atrial paced rhythm  Axis: Normal  Intervals:none  ST&T Change: None   PROCEDURES  Procedure(s) performed (including Critical Care):  Procedures   ____________________________________________   INITIAL IMPRESSION / ASSESSMENT AND PLAN / ED COURSE       79 year old female with past medical history of stroke, pacemaker, and hypertension who presents to the ED complaining of 2 days of left lower extremity weakness making it difficult for her to walk.  Patient is awake and alert on arrival but seems to have spent the majority of the past 2 days sitting on her couch and defecating on herself.  EKG shows paced rhythm with no ischemic changes.  We will check electrolytes and CK level, begin hydration with IV fluids given dehydration.  She is outside the window for any intervention into possible stroke, we will check head CT but patient would likely benefit from admission for further stroke work-up.  No symptoms to suggest infectious process at this time.  Patient to be turned over to oncoming provider pending results and likely  admission.      ____________________________________________   FINAL CLINICAL IMPRESSION(S) / ED DIAGNOSES  Final diagnoses:  Left leg weakness     ED Discharge Orders    None       Note:  This document was prepared using Dragon voice recognition software and may include unintentional dictation errors.   Chesley Noon, MD 08/06/20 [phone]

## 2020-08-06 NOTE — Assessment & Plan Note (Signed)
Verified DNR/DNI status with patient. 

## 2020-08-07 ENCOUNTER — Observation Stay: Payer: Medicare Other

## 2020-08-07 ENCOUNTER — Other Ambulatory Visit: Payer: Self-pay

## 2020-08-07 ENCOUNTER — Encounter: Payer: Self-pay | Admitting: Internal Medicine

## 2020-08-07 ENCOUNTER — Observation Stay: Admit: 2020-08-07 | Payer: Medicare Other

## 2020-08-07 ENCOUNTER — Observation Stay (HOSPITAL_COMMUNITY)
Admit: 2020-08-07 | Discharge: 2020-08-07 | Disposition: A | Discharge status: Home / Self Care | Payer: Medicare Other | Attending: Internal Medicine | Admitting: Internal Medicine

## 2020-08-07 DIAGNOSIS — G459 Transient cerebral ischemic attack, unspecified: Secondary | ICD-10-CM | POA: Diagnosis present

## 2020-08-07 DIAGNOSIS — R531 Weakness: Secondary | ICD-10-CM | POA: Diagnosis present

## 2020-08-07 DIAGNOSIS — L89322 Pressure ulcer of left buttock, stage 2: Secondary | ICD-10-CM | POA: Diagnosis present

## 2020-08-07 DIAGNOSIS — F17210 Nicotine dependence, cigarettes, uncomplicated: Secondary | ICD-10-CM | POA: Diagnosis present

## 2020-08-07 DIAGNOSIS — Z8673 Personal history of transient ischemic attack (TIA), and cerebral infarction without residual deficits: Secondary | ICD-10-CM | POA: Diagnosis not present

## 2020-08-07 DIAGNOSIS — F0390 Unspecified dementia without behavioral disturbance: Secondary | ICD-10-CM | POA: Diagnosis present

## 2020-08-07 DIAGNOSIS — Z7982 Long term (current) use of aspirin: Secondary | ICD-10-CM | POA: Diagnosis not present

## 2020-08-07 DIAGNOSIS — Z720 Tobacco use: Secondary | ICD-10-CM | POA: Diagnosis not present

## 2020-08-07 DIAGNOSIS — I10 Essential (primary) hypertension: Secondary | ICD-10-CM | POA: Diagnosis present

## 2020-08-07 DIAGNOSIS — L8990 Pressure ulcer of unspecified site, unspecified stage: Secondary | ICD-10-CM | POA: Insufficient documentation

## 2020-08-07 DIAGNOSIS — Z66 Do not resuscitate: Secondary | ICD-10-CM | POA: Diagnosis present

## 2020-08-07 DIAGNOSIS — Z882 Allergy status to sulfonamides status: Secondary | ICD-10-CM | POA: Diagnosis not present

## 2020-08-07 DIAGNOSIS — I495 Sick sinus syndrome: Secondary | ICD-10-CM | POA: Diagnosis present

## 2020-08-07 DIAGNOSIS — Z79899 Other long term (current) drug therapy: Secondary | ICD-10-CM | POA: Diagnosis not present

## 2020-08-07 DIAGNOSIS — R262 Difficulty in walking, not elsewhere classified: Secondary | ICD-10-CM | POA: Diagnosis present

## 2020-08-07 DIAGNOSIS — Z950 Presence of cardiac pacemaker: Secondary | ICD-10-CM | POA: Diagnosis not present

## 2020-08-07 DIAGNOSIS — Z20822 Contact with and (suspected) exposure to covid-19: Secondary | ICD-10-CM | POA: Diagnosis present

## 2020-08-07 LAB — CBC WITH DIFFERENTIAL/PLATELET
Abs Immature Granulocytes: 0.02 10*3/uL (ref 0.00–0.07)
Basophils Absolute: 0.1 10*3/uL (ref 0.0–0.1)
Basophils Relative: 1 %
Eosinophils Absolute: 0.1 10*3/uL (ref 0.0–0.5)
Eosinophils Relative: 2 %
HCT: 36.7 % (ref 36.0–46.0)
Hemoglobin: 12.6 g/dL (ref 12.0–15.0)
Immature Granulocytes: 0 %
Lymphocytes Relative: 23 %
Lymphs Abs: 2 10*3/uL (ref 0.7–4.0)
MCH: 30 pg (ref 26.0–34.0)
MCHC: 34.3 g/dL (ref 30.0–36.0)
MCV: 87.4 fL (ref 80.0–100.0)
Monocytes Absolute: 0.6 10*3/uL (ref 0.1–1.0)
Monocytes Relative: 7 %
Neutro Abs: 6 10*3/uL (ref 1.7–7.7)
Neutrophils Relative %: 67 %
Platelets: 250 10*3/uL (ref 150–400)
RBC: 4.2 MIL/uL (ref 3.87–5.11)
RDW: 14.5 % (ref 11.5–15.5)
WBC: 8.8 10*3/uL (ref 4.0–10.5)
nRBC: 0 % (ref 0.0–0.2)

## 2020-08-07 LAB — COMPREHENSIVE METABOLIC PANEL
ALT: 11 U/L (ref 0–44)
AST: 18 U/L (ref 15–41)
Albumin: 3.2 g/dL — ABNORMAL LOW (ref 3.5–5.0)
Alkaline Phosphatase: 60 U/L (ref 38–126)
Anion gap: 9 (ref 5–15)
BUN: 14 mg/dL (ref 8–23)
CO2: 23 mmol/L (ref 22–32)
Calcium: 9.1 mg/dL (ref 8.9–10.3)
Chloride: 104 mmol/L (ref 98–111)
Creatinine, Ser: 0.63 mg/dL (ref 0.44–1.00)
GFR, Estimated: >60 mL/min (ref >60)
Glucose, Bld: 96 mg/dL (ref 70–99)
Potassium: 3.7 mmol/L (ref 3.5–5.1)
Sodium: 136 mmol/L (ref 135–145)
Total Bilirubin: 0.7 mg/dL (ref 0.3–1.2)
Total Protein: 5.8 g/dL — ABNORMAL LOW (ref 6.5–8.1)

## 2020-08-07 LAB — LIPID PANEL
Cholesterol: 123 mg/dL (ref 0–200)
HDL: 38 mg/dL — ABNORMAL LOW (ref >40)
LDL Cholesterol: 68 mg/dL (ref 0–99)
Total CHOL/HDL Ratio: 3.2 RATIO
Triglycerides: 85 mg/dL (ref <150)
VLDL: 17 mg/dL (ref 0–40)

## 2020-08-07 LAB — ECHOCARDIOGRAM COMPLETE BUBBLE STUDY: S' Lateral: 2.76 cm

## 2020-08-07 NOTE — Progress Notes (Signed)
*  PRELIMINARY RESULTS* Echocardiogram 2D Echocardiogram has been performed.  Cristela Blue 08/07/2020, 12:34 PM

## 2020-08-07 NOTE — Evaluation (Signed)
Physical Therapy Evaluation Patient Details Name: Donna Dodson MRN: [id number] DOB: 1941/05/19 Today's Date: 08/07/2020   History of Present Illness  Pt is 79 y/o F with PMH: PPM d/t SSS, stroke, mandibular abscess, and chronic tobacco abuse. Pt was just admitted in October 2021 d/t fall from bed with subsequent rhabdomyolysis. Pt brought in by EMS on this hospitalization, after being found on couch by son, covered in urine and feces with 2 day c/o LE weakness.  Clinical Impression  Pt is a pleasant 79 year old female who was admitted for inability to ambulate. Pt received in chair upon arrival finishing lunch. Pt performs transfers and ambulation with min assist and RW. Pt demonstrates deficits with strength in L LE/endurance/balance. Currently isn't at baseline level or safe to be at home alone. Very high falls risk and needs assist for functional transfers. Would benefit from skilled PT to address above deficits and promote optimal return to PLOF; recommend transition to STR upon discharge from acute hospitalization.     Follow Up Recommendations SNF    Equipment Recommendations  Rolling walker with 5" wheels    Recommendations for Other Services       Precautions / Restrictions Precautions Precautions: Fall Restrictions Weight Bearing Restrictions: No      Mobility  Bed Mobility               General bed mobility comments: not performed as pt received seated in recliner eating lunch    Transfers Overall transfer level: Needs assistance Equipment used: Rolling walker (2 wheeled) Transfers: Sit to/from Stand Sit to Stand: Min assist         General transfer comment: needs cues for hand placement. First attempt to stand without assistance, unable to lift buttocks off chair. 2nd attempt with min assist and upright posture  Ambulation/Gait Ambulation/Gait assistance: Min assist Gait Distance (Feet): 2 Feet Assistive device: Rolling walker (2 wheeled) Gait  Pattern/deviations: Step-to pattern     General Gait Details: ambulated a few steps in forward/backward direction using RW. Very shaky and unsteady, needs assist for upright posture. Fatigues quickly  Information systems manager [employer] (Stroke Patients Only)       Balance Overall balance assessment: Needs assistance Sitting-balance support: Feet supported Sitting balance-Leahy Scale: Fair     Standing balance support: Bilateral upper extremity supported Standing balance-Leahy Scale: Fair                               Pertinent Vitals/Pain Pain Assessment: No/denies pain    Home Living Family/patient expects to be discharged to:: Private residence Living Arrangements: Alone Available Help at Discharge: Family;Available PRN/intermittently (Pt reports son works a lot) Type of Home: House Home Access: Level entry     Home Layout: One level Home Equipment: Environmental consultant - 4 wheels;Grab bars - tub/shower;Shower seat - built in Additional Comments: unsure of accurate historian    Prior Function Level of Independence: Needs assistance   Gait / Transfers Assistance Needed: Pt is somewhat questionable historian. She reports regular use of rollator for fxl mobility both in and out of the home.  ADL's / Homemaking Assistance Needed: Pt reports she is able to perform her basic self care I'ly. States she used to have someone help with IADLs such as cleaning/laundry-"Donna Dodson" But states she left and she doesn't know who helps her with IADLs now  Comments: endorses "a couple" falls in last 6 months, unsure how many     Hand Dominance        Extremity/Trunk Assessment   Upper Extremity Assessment Upper Extremity Assessment: Overall WFL for tasks assessed    Lower Extremity Assessment Lower Extremity Assessment: Generalized weakness (L LE grossly 4/5; R LE grossly 4+/5)       Communication   Communication: No difficulties  Cognition  Arousal/Alertness: Awake/alert Behavior During Therapy: WFL for tasks assessed/performed Overall Cognitive Status: No family/caregiver present to determine baseline cognitive functioning                                 General Comments: Pt very restless and agitated that therapist in room during lunch. Very vague in answers      General Comments      Exercises Other Exercises Other Exercises: Seated ther-ex performed including hip abd/add, SLRs, and LAQ. All ther-ex performed x 10 reps with cga   Assessment/Plan    PT Assessment Patient needs continued PT services  PT Problem List Decreased strength;Decreased balance;Decreased mobility;Decreased knowledge of use of DME       PT Treatment Interventions Gait training;DME instruction;Therapeutic activities;Therapeutic exercise;Balance training    PT Goals (Current goals can be found in the Care Plan section)  Acute Rehab PT Goals Patient Stated Goal: to get stronger and better balance PT Goal Formulation: With patient Time For Goal Achievement: 08/21/20 Potential to Achieve Goals: Good    Frequency Min 2X/week   Barriers to discharge        Co-evaluation               AM-PAC PT "6 Clicks" Mobility  Outcome Measure Help needed turning from your back to your side while in a flat bed without using bedrails?: A Little Help needed moving from lying on your back to sitting on the side of a flat bed without using bedrails?: A Little Help needed moving to and from a bed to a chair (including a wheelchair)?: A Little Help needed standing up from a chair using your arms (e.g., wheelchair or bedside chair)?: A Little Help needed to walk in hospital room?: A Little Help needed climbing 3-5 steps with a railing? : A Lot 6 Click Score: 17    End of Session Equipment Utilized During Treatment: Gait belt Activity Tolerance: Patient tolerated treatment well Patient left: in chair;with chair alarm set;with SCD's  reapplied Nurse Communication: Mobility status PT Visit Diagnosis: Muscle weakness (generalized) (M62.81);Difficulty in walking, not elsewhere classified (R26.2);Unsteadiness on feet (R26.81)    Time: 1329-1350 PT Time Calculation (min) (ACUTE ONLY): 21 min   Charges:   PT Evaluation $PT Eval Low Complexity: 1 Low PT Treatments $Therapeutic Exercise: 8-22 mins        Elizabeth Palau, PT, DPT [phone]   Amar Keenum 08/07/2020, 4:54 PM

## 2020-08-07 NOTE — Evaluation (Signed)
Occupational Therapy Evaluation Patient Details Name: Donna Dodson MRN: [id number] DOB: Dec 06, 1940 Today's Date: 08/07/2020    History of Present Illness Pt is 79 y/o F with PMH: PPM d/t SSS, stroke, mandibular abscess, and chronic tobacco abuse. Pt was just admitted in October 2021 d/t fall from bed with subsequent rhabdomyolysis. Pt brought in by EMS on this hospitalization, after being found on couch by son, covered in urine and feces with 2 day c/o LE weakness.   Clinical Impression   Pt seen for OT evaluation this date in setting of acute hospitalization with c/o LE weakness and unable to get up. Pt reports being INDEP at baseline with BADLs, but endorses having help for [facility] IADLs from "[place]" but states this person left and she hasn't had help since besides her son that checks on her occasionally. Pt states that son gets her groceries and states she calls medical transport companies for MD appts. Pt reports using 4WW for fxl mobility at baseline. This date, pt presents with some weakness, but overall good ROM and functional MMT. However, pt is presenting with decreased sensation in her L hand and L LE. Pt reports "it just doesn't feel right, maybe numb". OT faciliates pt participation in sup to sit with MOD I with use of bed rails, HOB elebated and increased time. Pt with no c/o dizziness upon coming to sit, demos G static sitting balance. Pt requires MIN/MOD A for ADL Transfers with RW and MIN A to sustain balance with RW to take small shuffling steps from bed to chair. Pt does become unsteady requiring MOD A To control descent to chair. Pt endorses becoming fearful d/t falls in the past. Pt requires SETUP with bed level and seated UB/LB ADLs, but MIN/MOD A for all standing UB ADLs d/t poor balance. Will continue to follow. Anticipate pt will require STR upon d/c to increased balance for standing ADLs and fxl mobility to decreased risk of falls.     Follow Up Recommendations  SNF    Equipment  Recommendations  3 in 1 bedside commode;Other (comment) (2WW)    Recommendations for Other Services       Precautions / Restrictions Precautions Precautions: Fall Restrictions Weight Bearing Restrictions: No Other Position/Activity Restrictions: reports L LE and L hand numbness and confirmed on assessment      Mobility Bed Mobility Overal bed mobility: Modified Independent             General bed mobility comments: HOB elevated, use of bed rails, increased time.    Transfers Overall transfer level: Needs assistance Equipment used: Rolling walker (2 wheeled) Transfers: Sit to/from Stand Sit to Stand: Min assist;Mod assist              Balance Overall balance assessment: Needs assistance Sitting-balance support: Feet supported Sitting balance-Leahy Scale: Fair Sitting balance - Comments: intermittent use of UEs for static sitting support, but no external support   Standing balance support: Bilateral upper extremity supported Standing balance-Leahy Scale: Poor Standing balance comment: requires MIN A to sustain static standing balance and RW for B UE support                           ADL either performed or assessed with clinical judgement   ADL  General ADL Comments: SETUP for bed level LB dressing to don socks. SETUP for seated UB ADLs to brush teeth/wash face. Pt requires MIN/MOD A with CTS, MIN A With RW for standing balance/to perform any standing UB ADLs 2/2 decreased balance. MIN A with RW to take ~4 shuffling steps with RW from EOB to recliner adjacent.     Vision Patient Visual Report: No change from baseline       Perception     Praxis      Pertinent Vitals/Pain Pain Assessment: No/denies pain     Hand Dominance Right   Extremity/Trunk Assessment Upper Extremity Assessment Upper Extremity Assessment: Generalized weakness;RUE deficits/detail;LUE deficits/detail RUE  Deficits / Details: ROM/MMT WFL LUE Deficits / Details: ROM/MMT WFL, but pt reports feels different versus R hand on MMT assessment such as grip/grasp assessment. LUE Sensation: decreased light touch;decreased proprioception LUE Coordination: decreased fine motor           Communication Communication Communication: No difficulties   Cognition Arousal/Alertness: Awake/alert Behavior During Therapy: WFL for tasks assessed/performed Overall Cognitive Status: No family/caregiver present to determine baseline cognitive functioning                                 General Comments: Pt is oriented to year, and "hospital". Not oriented to other aspects of time and situation. In addition, while she is able to provide most PLOF/home setup information that aligns with her previous therapy evaluations, some does not such as "steps to enter" and pt unsure of where her son lives even though he is the primary family member that comes to check on her.   General Comments       Exercises Other Exercises Other Exercises: OT facilitates ed re: role of OT in acute setting, safety considerations including use of call light and chair alarm. Other Exercises: OT facilitates pt participation in seated MIP x10 bilaterally in prep for attempting transfers. In addition, once seated in chair, OT engages pt in gluteal squeezes for one set x10 reps.   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 She reports regular use of rollator for fxl mobility both in and out of the home. ADL's / Homemaking Assistance Needed: Pt reports she is able to perform her basic self care I'ly. States she used to have someone help with IADLs such as cleaning/laundry-"[place]" But states she left and she doesn't know who helps her with IADLs now   Comments: endorses "a couple" falls in last 6 months, unsure how many        OT Problem List: Decreased activity tolerance;Impaired balance (sitting  and/or standing);Decreased coordination;Decreased safety awareness;Decreased knowledge of use of DME or AE;Decreased knowledge of precautions;Impaired sensation      OT Treatment/Interventions: Self-care/ADL training;DME and/or AE instruction;Therapeutic activities;Balance training;Therapeutic exercise;Neuromuscular education;Patient/family education    OT Goals(Current goals can be found in the care plan section) Acute Rehab OT Goals Patient Stated Goal: to get stronger and better balance OT Goal Formulation: With patient Time For Goal Achievement: 08/21/20 Potential to Achieve Goals: Good ADL Goals Pt Will Perform Upper Body Dressing: with modified independence;sitting Pt Will Perform Lower Body Dressing: with set-up;sit to/from stand Pt Will Transfer to Toilet: with supervision;ambulating (with LRAD) Pt/caregiver will Perform Home Exercise Program: Increased strength;Both right and left upper extremity;With Supervision  OT Frequency: Min 1X/week   Barriers to D/C:            Co-evaluation              AM-PAC OT "6 Clicks" Daily Activity     Outcome Measure Help from another person eating meals?:  None Help from another person taking care of personal grooming?: A Little Help from another person toileting, which includes using toliet, bedpan, or urinal?: A Lot Help from another person bathing (including washing, rinsing, drying)?: A Lot Help from another person to put on and taking off regular upper body clothing?: A Little Help from another person to put on and taking off regular lower body clothing?: A Little 6 Click Score: 17   End of Session Equipment Utilized During Treatment: Gait belt;Rolling walker Nurse Communication: Mobility status  Activity Tolerance: Patient tolerated treatment well Patient left: in chair;with call bell/phone within reach;with chair alarm set  OT Visit Diagnosis: Unsteadiness on feet (R26.81);Muscle weakness (generalized) (M62.81)                Time: 2956-2130 OT Time Calculation (min): 57 min Charges:  OT General Charges $OT Visit: 1 Visit OT Evaluation $OT Eval Moderate Complexity: 1 Mod OT Treatments $Self Care/Home Management : 23-37 mins $Therapeutic Activity: 23-37 mins  Rejeana Brock, MS, OTR/L ascom [phone] 08/07/20, 11:37 AM

## 2020-08-07 NOTE — Progress Notes (Addendum)
PROGRESS NOTE    Donna Dodson  IRC:[id number] DOB: 06/25/1941 DOA: 08/06/2020 PCP: Patient, No Pcp Per   Brief Narrative:  79 yo WF with hx of PPM due to SSS, chronic tobacco abuse, presents to ER via EMS. Pt states pt was about 2 days she was sitting on her couch unable to get up.  Reportedly per EMS, pt was found on couch in her own feces and urine. ER provider stated that pt's son had found pt and called EMS. Pt lives alone. Pt states her son used to live with her but has since moved out. Pt denies any F/C/N/V. No headaches. No chest pain or SOB. Pt states her left leg is weak and was unable to stand up from her couch. Pt did not have her cell phone near her.  Workup in ER unremarkable. CT head negative. Pt has a PPM. Unclear if it is MRI compatible.  Due to pt's leg weakness and unsafe discharge plan, [facility] hospitalist contacted for admission.    ED Course: pt seen in ER. CT head negative for acute CVA. No safe discharge plan as pt lives alone.  Assessment & Plan:   Principal Problem:   Unable to walk Active Problems:   Tobacco abuse   S/P placement of cardiac pacemaker - [employer] Adapta [id number], serial # [id number]   DNR (do not resuscitate)   Pressure injury of skin   Unable to walk/generalized weakness: Per notes, patient presented with just generalized weakness but she tells me that she was having weakness in the left lower extremity.  And now she is also having weakness in the right lower extremity.  Patient is alert and oriented.  On examination, she does not have any focal deficit although she has generalized weakness in all 4 extremities.  CT head negative for any stroke.  Patient has pacemaker and we have not been able to find the type of pacemaker and whether that is MRI compatible or not.  MRI tech as well as I have called patient's son and have not been able to connect with him.  Although I highly doubt that she has any stroke but it can only be ruled out with negative  MRI if we can do that.  PT OT is already consulted.  Interestingly, all her labs are within normal range including CBC, CMP, lactic acid, ethanol, UA and even CK.  Addendum at 10:22 AM: I was informed by MRI tech that per her research, patient's PPM is not MRI compatible and she cannot have MRI.  Again, she does not have any focal deficit so I doubt stroke but she can be reviewed it in the morning and if needed, CT head can be repeated.  S/P placement of cardiac pacemaker - [employer] Adapta [id number], serial # [id number] Unclear if her pacemaker is MRI compatible.  I have reached out to the MRI tech to find out from [employer] personnel if this is MRI compatible or not.  I have been informed by MRI tech that [facility] does not have the facility to perform MRI with any sort of pacemaker and that patient needs to go to [facility] for that but for that reason to, we need to know the type of pacemaker she has.  Tobacco abuse: Pt still smoking cigarettes.  Cessation counseling provided.  DVT prophylaxis: enoxaparin (LOVENOX) injection 40 mg Start: 08/06/20 2245 SCDs Start: 08/06/20 2240   Code Status: DNR  Family Communication: None present at bedside.  Plan of care discussed with patient  in length and he verbalized understanding and agreed with it.  Also try to call her son but no response.  Left voicemail.  Status is: Observation  The patient will require care spanning > 2 midnights and should be moved to inpatient because: Ongoing diagnostic testing needed not appropriate for outpatient work up  Dispo: The patient is from: Home              Anticipated d/c is to: Unknown but will likely require SNF.              Anticipated d/c date is: 1 day              Patient currently is not medically stable to d/c.        Estimated body mass index is 27.75 kg/m as calculated from the following:   Height as of 07/02/20: 5\' 6"  (1.676 m).   Weight as of this encounter: 78 kg.  Pressure Injury 08/07/20  Buttocks Left Stage 2 -  Partial thickness loss of dermis presenting as a shallow open injury with a red, pink wound bed without slough. small openened, pink area (Active)  08/07/20 0415  Location: Buttocks  Location Orientation: Left  Staging: Stage 2 -  Partial thickness loss of dermis presenting as a shallow open injury with a red, pink wound bed without slough.  Wound Description (Comments): small openened, pink area  Present on Admission: Yes     Nutritional status:               Consultants:   None  Procedures:   None  Antimicrobials:  Anti-infectives (From admission, onward)   None         Subjective: Seen and examined.  Patient completely alert and oriented.  She tells me that she came in yesterday because she was having left lower extremity weakness and now she is having right lower extremity weakness however on examination, she has generalized weakness which is equal in all 4 extremities.  She has no other complaint.  Objective: Vitals:   08/07/20 0200 08/07/20 0230 08/07/20 0414 08/07/20 0723  BP: 97/60 93/63 112/73 112/79  Pulse: (!) 59 (!) 59 84 (!) 59  Resp: 16 17 16 17   Temp:   98.1 F (36.7 C) 98.1 F (36.7 C)  TempSrc:   Oral   SpO2: 93% 95% 99% 97%  Weight:        Intake/Output Summary (Last 24 hours) at 08/07/2020 0948 Last data filed at 08/07/2020 [id number] Gross per 24 hour  Intake 1000 ml  Output 200 ml  Net 800 ml   Filed Weights   08/06/20 1443  Weight: 78 kg    Examination:  General exam: Appears calm and comfortable  Respiratory system: Clear to auscultation. Respiratory effort normal. Cardiovascular system: S1 & S2 heard, RRR. No JVD, murmurs, rubs, gallops or clicks. No pedal edema. Gastrointestinal system: Abdomen is nondistended, soft and nontender. No organomegaly or masses felt. Normal bowel sounds heard. Central nervous system: Alert and oriented. No focal neurological deficits. Extremities: Symmetric  Skin: No  rashes, lesions or ulcers Psychiatry: Judgement and insight appear normal. Mood & affect appropriate.    Data Reviewed: I have personally reviewed following labs and imaging studies  CBC: Recent Labs  Lab 08/06/20 1447 08/07/20 0646  WBC 11.6* 8.8  NEUTROABS 9.9* 6.0  HGB 14.2 12.6  HCT 42.6 36.7  MCV 89.5 87.4  PLT 285 250   Basic Metabolic Panel: Recent Labs  Lab 08/06/20 1447  08/07/20 0646  NA 138 136  K 4.0 3.7  CL 104 104  CO2 23 23  GLUCOSE 88 96  BUN 10 14  CREATININE 0.67 0.63  CALCIUM 9.2 9.1   GFR: Estimated Creatinine Clearance: 62.1 mL/min (by C-G formula based on SCr of 0.63 mg/dL). Liver Function Tests: Recent Labs  Lab 08/06/20 1447 08/07/20 0646  AST 25 18  ALT 14 11  ALKPHOS 71 60  BILITOT 0.7 0.7  PROT 7.1 5.8*  ALBUMIN 3.9 3.2*   No results for input(s): LIPASE, AMYLASE in the last 168 hours. No results for input(s): AMMONIA in the last 168 hours. Coagulation Profile: Recent Labs  Lab 08/06/20 1805  INR 1.0   Cardiac Enzymes: Recent Labs  Lab 08/06/20 1447  CKTOTAL 112   BNP (last 3 results) No results for input(s): PROBNP in the last 8760 hours. HbA1C: No results for input(s): HGBA1C in the last 72 hours. CBG: No results for input(s): GLUCAP in the last 168 hours. Lipid Profile: Recent Labs    08/07/20 0646  CHOL 123  HDL 38*  LDLCALC 68  TRIG 85  CHOLHDL 3.2   Thyroid Function Tests: No results for input(s): TSH, T4TOTAL, FREET4, T3FREE, THYROIDAB in the last 72 hours. Anemia Panel: No results for input(s): VITAMINB12, FOLATE, FERRITIN, TIBC, IRON, RETICCTPCT in the last 72 hours. Sepsis Labs: Recent Labs  Lab 08/06/20 1452 08/06/20 1805  LATICACIDVEN 1.5 1.0    Recent Results (from the past 240 hour(s))  Resp Panel by RT-PCR (Flu A&B, Covid) Nasopharyngeal Swab     Status: None   Collection Time: 08/06/20  3:38 PM   Specimen: Nasopharyngeal Swab; Nasopharyngeal(NP) swabs in vial transport medium  Result  Value Ref Range Status   SARS Coronavirus 2 by RT PCR NEGATIVE NEGATIVE Final    Comment: (NOTE) SARS-CoV-2 target nucleic acids are NOT DETECTED.  The SARS-CoV-2 RNA is generally detectable in upper respiratory specimens during the acute phase of infection. The lowest concentration of SARS-CoV-2 viral copies this assay can detect is 138 copies/mL. A negative result does not preclude SARS-Cov-2 infection and should not be used as the sole basis for treatment or other patient management decisions. A negative result may occur with  improper specimen collection/handling, submission of specimen other than nasopharyngeal swab, presence of viral mutation(s) within the areas targeted by this assay, and inadequate number of viral copies(<138 copies/mL). A negative result must be combined with clinical observations, patient history, and epidemiological information. The expected result is Negative.  Fact Sheet for Patients:  BloggerCourse.com  Fact Sheet for Healthcare Providers:  SeriousBroker.it  This test is no t yet approved or cleared by the [place] FDA and  has been authorized for detection and/or diagnosis of SARS-CoV-2 by FDA under an Emergency Use Authorization (EUA). This EUA will remain  in effect (meaning this test can be used) for the duration of the COVID-19 declaration under Section 564(b)(1) of the Act, 21 U.S.C.section 360bbb-3(b)(1), unless the authorization is terminated  or revoked sooner.       Influenza A by PCR NEGATIVE NEGATIVE Final   Influenza B by PCR NEGATIVE NEGATIVE Final    Comment: (NOTE) The Xpert Xpress SARS-CoV-2/FLU/RSV plus assay is intended as an aid in the diagnosis of influenza from Nasopharyngeal swab specimens and should not be used as a sole basis for treatment. Nasal washings and aspirates are unacceptable for Xpert Xpress SARS-CoV-2/FLU/RSV testing.  Fact Sheet for  Patients: BloggerCourse.com  Fact Sheet for Healthcare Providers: SeriousBroker.it  This test is not yet approved or cleared by the [place] and has been authorized for detection and/or diagnosis of SARS-CoV-2 by FDA under an Emergency Use Authorization (EUA). This EUA will remain in effect (meaning this test can be used) for the duration of the COVID-19 declaration under Section 564(b)(1) of the Act, 21 U.S.C. section 360bbb-3(b)(1), unless the authorization is terminated or revoked.  Performed at [facility], [address], [place], [place] [address]       Radiology Studies: DG Chest 2 View  Result Date: 08/06/2020 CLINICAL DATA:  Weakness. EXAM: CHEST - 2 VIEW COMPARISON:  July 02, 2020. FINDINGS: The heart size and mediastinal contours are within normal limits. Similar positioning of left subclavian approach cardiac rhythm maintenance device. No consolidation. Suspected right upper lung pulmonary nodule. No visible pleural effusions or pneumothorax. No acute osseous abnormality. Left shoulder degenerative change. IMPRESSION: 1. No acute cardiopulmonary disease. 2. Suspected right upper lung pulmonary nodule. Recommend chest CT for further evaluation. Electronically Signed   By: Feliberto Harts MD   On: 08/06/2020 15:09   CT Head Wo Contrast  Result Date: 08/06/2020 CLINICAL DATA:  Altered mental status. Left lower extremity weakness EXAM: CT HEAD WITHOUT CONTRAST TECHNIQUE: Contiguous axial images were obtained from the base of the skull through the vertex without intravenous contrast. COMPARISON:  July 02, 2020 and April 17, 2018 FINDINGS: Brain: Diffuse atrophy is stable compared to prior studies. No intracranial mass, hemorrhage, extra-axial fluid collection, or midline shift. Patchy small vessel disease throughout the centra semiovale bilaterally appear stable. No acute infarct is evident. Small vessel  disease throughout the anterior limb of the right external capsule noted, stable. Vascular: No hyperdense vessel. No appreciable vascular calcification. Skull: Bony calvarium appears intact. Sinuses/Orbits: Visualized paranasal sinuses are clear. Visualized orbits appear symmetric bilaterally. Other: Visualized mastoid air cells are clear. IMPRESSION: Diffuse atrophy is stable. There is stable small vessel disease in the centra semiovale bilaterally. Small vessel disease in the anterior limb of the right external capsule is likewise stable. No acute infarct. No mass or hemorrhage. Electronically Signed   By: Bretta Bang III M.D.   On: 08/06/2020 15:15   US Carotid Bilateral  Result Date: 08/07/2020 CLINICAL DATA:  TIA.  History of smoking. EXAM: BILATERAL CAROTID DUPLEX ULTRASOUND TECHNIQUE: Wallace Cullens scale imaging, color Doppler and duplex ultrasound were performed of bilateral carotid and vertebral arteries in the neck. COMPARISON:  Carotid ultrasound-03/20/2016 FINDINGS: Criteria: Quantification of carotid stenosis is based on velocity parameters that correlate the residual internal carotid diameter with NASCET-based stenosis levels, using the diameter of the distal internal carotid lumen as the denominator for stenosis measurement. The following velocity measurements were obtained: RIGHT ICA: 71/19 cm/sec CCA: 89/14 cm/sec SYSTOLIC ICA/CCA RATIO:  1.0 ECA: 60 cm/sec LEFT ICA: 72/19 cm/sec CCA: 72/11 cm/sec SYSTOLIC ICA/CCA RATIO:  1.3 ECA: 65 cm/sec RIGHT CAROTID ARTERY: There is a minimal amount intimal thickening/atherosclerotic plaque within the right carotid bulb (image 18), slightly progressed compared to the 2017 examination though not resulting in elevated peak systolic velocities within the interrogated course of the right internal carotid artery to suggest a hemodynamically significant stenosis. RIGHT VERTEBRAL ARTERY:  Antegrade flow LEFT CAROTID ARTERY: There is no grayscale evidence of  significant intimal thickening or atherosclerotic plaque affecting the interrogated portions of the left carotid system. There are no elevated peak systolic velocities within the interrogated course of the left internal carotid artery to suggest a hemodynamically significant stenosis. LEFT VERTEBRAL ARTERY:  Antegrade  flow IMPRESSION: 1. Very minimal amount of right-sided intimal thickening/atherosclerotic plaque, slightly progressed compared to the 2017 examination, though not resulting in elevated peak systolic velocities within the right internal carotid artery. 2. Normal sonographic evaluation of the left carotid system. Electronically Signed   By: Simonne Come M.D.   On: 08/07/2020 07:33    Scheduled Meds: . aspirin EC  81 mg Oral Daily  . enoxaparin (LOVENOX) injection  40 mg Subcutaneous Q24H  . nicotine  14 mg Transdermal Daily   Continuous Infusions:   LOS: 0 days   Time spent: 37 minutes   Hughie Closs, MD [facility]  08/07/2020, 9:48 AM   To contact the attending provider between 7A-7P or the covering provider during after hours 7P-7A, please log into the web site [email or web address].

## 2020-08-07 NOTE — Consult Note (Signed)
WOC Nurse Consult Note: Reason for Consult: Stage 2 Pressure Injury Wound type: Stage 2 pressure injury Pressure Injury POA: Yes Measurement: 1cm x 1.4cm x 0.1cm  Wound bed: yellow with epithelial buds Drainage (amount, consistency, odor) none Periwound:intact  Dressing procedure/placement/frequency: Continue silicone foam as implemented appropriately by the skin care order set.     Re consult if needed, will not follow at this time. Thanks  Keely Drennan [employer], RN,CWOCN, CNS, CWON-AP [phone])

## 2020-08-08 ENCOUNTER — Inpatient Hospital Stay: Payer: Medicare Other

## 2020-08-08 DIAGNOSIS — R262 Difficulty in walking, not elsewhere classified: Secondary | ICD-10-CM | POA: Diagnosis not present

## 2020-08-08 DIAGNOSIS — Z950 Presence of cardiac pacemaker: Secondary | ICD-10-CM | POA: Diagnosis not present

## 2020-08-08 DIAGNOSIS — Z720 Tobacco use: Secondary | ICD-10-CM | POA: Diagnosis not present

## 2020-08-08 LAB — VITAMIN B12: Vitamin B-12: 204 pg/mL (ref 180–914)

## 2020-08-08 NOTE — Progress Notes (Addendum)
Progress Note    Donna Dodson  NWG:[id number] DOB: 1940-11-12  DOA: 08/06/2020 PCP: Patient, No Pcp Per      Brief Narrative:    Medical records reviewed and are as summarized below:  Donna Dodson is a 79 y.o. female with medical history significant for sick sinus syndrome status post permanent pacemaker, dementia, tobacco use disorder, presented to the hospital because of weakness in the left leg and inability to walk.  Reportedly, EMS found to have a couch landing on feces and urine.  She lives alone at home.      Assessment/Plan:   Principal Problem:   Unable to walk Active Problems:   Tobacco abuse   S/P placement of cardiac pacemaker - Medtronic Adapta [id number], serial # [id number]   DNR (do not resuscitate)   Pressure injury of skin   Body mass index is 27.75 kg/m.     Inability to walk, left leg weakness: Initial CT head did not show any acute abnormality.  Repeat CT head today.  MRI cannot be done because he has a pacemaker and MRI compatibility cannot be verified.  Continue PT and OT.  PT and OT recommended discharge to SNF.  Awaiting placement to SNF.  Check vitamin B12 level.  Tobacco use disorder: Counseled to quit smoking cigarettes.  Dementia: Continue supportive care  Diet Order            Diet regular Room service appropriate? Yes; Fluid consistency: Thin  Diet effective now                    Consultants:  None  Procedures:  None    Medications:   . aspirin EC  81 mg Oral Daily  . enoxaparin (LOVENOX) injection  40 mg Subcutaneous Q24H  . nicotine  14 mg Transdermal Daily   Continuous Infusions:   Anti-infectives (From admission, onward)   None             Family Communication/Anticipated D/C date and plan/Code Status   DVT prophylaxis: enoxaparin (LOVENOX) injection 40 mg Start: 08/06/20 2245 SCDs Start: 08/06/20 2240     Code Status: DNR  Family Communication: None Disposition Plan:    Status is:  Inpatient  Remains inpatient appropriate because:Unsafe d/c plan   Dispo: The patient is from: SNF              Anticipated d/c is to: SNF              Anticipated d/c date is: 1 day              Patient currently is not medically stable to d/c.           Subjective:   C/o weakness in left leg and inability to walk.  Objective:    Vitals:   08/07/20 1916 08/07/20 2315 08/08/20 0400 08/08/20 0741  BP: 102/61 118/65 105/69 108/68  Pulse: 66 67 64 62  Resp: Temp: 97.9 F (36.6 C) 98.2 F (36.8 C) 97.9 F (36.6 C) 98.4 F (36.9 C)  TempSrc: Oral Oral Oral   SpO2: 99% 97% 98% 97%  Weight:       No data found.   Intake/Output Summary (Last 24 hours) at 08/08/2020 1233 Last data filed at 08/08/2020 0900 Gross per 24 hour  Intake 240 ml  Output 2025 ml  Net -1785 ml   Filed Weights   08/06/20 1443  Weight: 78 kg  Exam:  GEN: NAD SKIN: Warm and dry EYES: EOMI.  No pallor or icterus ENT: MMM CV: RRR PULM: CTA B ABD: soft, ND, NT, +BS CNS: AAO x 1 (person), non focal.  She moves all extremities spontaneously. EXT: No edema or tenderness   Data Reviewed:   I have personally reviewed following labs and imaging studies:  Labs: Labs show the following:   Basic Metabolic Panel: Recent Labs  Lab 08/06/20 1447 08/07/20 0646  NA 138 136  K 4.0 3.7  CL 104 104  CO2 23 23  GLUCOSE 88 96  BUN 10 14  CREATININE 0.67 0.63  CALCIUM 9.2 9.1   GFR Estimated Creatinine Clearance: 62.1 mL/min (by C-G formula based on SCr of 0.63 mg/dL). Liver Function Tests: Recent Labs  Lab 08/06/20 1447 08/07/20 0646  AST 25 18  ALT 14 11  ALKPHOS 71 60  BILITOT 0.7 0.7  PROT 7.1 5.8*  ALBUMIN 3.9 3.2*   No results for input(s): LIPASE, AMYLASE in the last 168 hours. No results for input(s): AMMONIA in the last 168 hours. Coagulation profile Recent Labs  Lab 08/06/20 1805  INR 1.0    CBC: Recent Labs  Lab 08/06/20 1447 08/07/20 0646   WBC 11.6* 8.8  NEUTROABS 9.9* 6.0  HGB 14.2 12.6  HCT 42.6 36.7  MCV 89.5 87.4  PLT 285 250   Cardiac Enzymes: Recent Labs  Lab 08/06/20 1447  CKTOTAL 112   BNP (last 3 results) No results for input(s): PROBNP in the last 8760 hours. CBG: No results for input(s): GLUCAP in the last 168 hours. D-Dimer: No results for input(s): DDIMER in the last 72 hours. Hgb A1c: No results for input(s): HGBA1C in the last 72 hours. Lipid Profile: Recent Labs    08/07/20 0646  CHOL 123  HDL 38*  LDLCALC 68  TRIG 85  CHOLHDL 3.2   Thyroid function studies: No results for input(s): TSH, T4TOTAL, T3FREE, THYROIDAB in the last 72 hours.  Invalid input(s): FREET3 Anemia work up: No results for input(s): VITAMINB12, FOLATE, FERRITIN, TIBC, IRON, RETICCTPCT in the last 72 hours. Sepsis Labs: Recent Labs  Lab 08/06/20 1447 08/06/20 1452 08/06/20 1805 08/07/20 0646  WBC 11.6*  --   --  8.8  LATICACIDVEN  --  1.5 1.0  --     Microbiology Recent Results (from the past 240 hour(s))  Resp Panel by RT-PCR (Flu A&B, Covid) Nasopharyngeal Swab     Status: None   Collection Time: 08/06/20  3:38 PM   Specimen: Nasopharyngeal Swab; Nasopharyngeal(NP) swabs in vial transport medium  Result Value Ref Range Status   SARS Coronavirus 2 by RT PCR NEGATIVE NEGATIVE Final    Comment: (NOTE) SARS-CoV-2 target nucleic acids are NOT DETECTED.  The SARS-CoV-2 RNA is generally detectable in upper respiratory specimens during the acute phase of infection. The lowest concentration of SARS-CoV-2 viral copies this assay can detect is 138 copies/mL. A negative result does not preclude SARS-Cov-2 infection and should not be used as the sole basis for treatment or other patient management decisions. A negative result may occur with  improper specimen collection/handling, submission of specimen other than nasopharyngeal swab, presence of viral mutation(s) within the areas targeted by this assay, and  inadequate number of viral copies(<138 copies/mL). A negative result must be combined with clinical observations, patient history, and epidemiological information. The expected result is Negative.  Fact Sheet for Patients:  BloggerCourse.com  Fact Sheet for Healthcare Providers:  SeriousBroker.it  This test is no  t yet approved or cleared by the [place] and  has been authorized for detection and/or diagnosis of SARS-CoV-2 by FDA under an Emergency Use Authorization (EUA). This EUA will remain  in effect (meaning this test can be used) for the duration of the COVID-19 declaration under Section 564(b)(1) of the Act, 21 U.S.C.section 360bbb-3(b)(1), unless the authorization is terminated  or revoked sooner.       Influenza A by PCR NEGATIVE NEGATIVE Final   Influenza B by PCR NEGATIVE NEGATIVE Final    Comment: (NOTE) The Xpert Xpress SARS-CoV-2/FLU/RSV plus assay is intended as an aid in the diagnosis of influenza from Nasopharyngeal swab specimens and should not be used as a sole basis for treatment. Nasal washings and aspirates are unacceptable for Xpert Xpress SARS-CoV-2/FLU/RSV testing.  Fact Sheet for Patients: BloggerCourse.com  Fact Sheet for Healthcare Providers: SeriousBroker.it  This test is not yet approved or cleared by the [place] FDA and has been authorized for detection and/or diagnosis of SARS-CoV-2 by FDA under an Emergency Use Authorization (EUA). This EUA will remain in effect (meaning this test can be used) for the duration of the COVID-19 declaration under Section 564(b)(1) of the Act, 21 U.S.C. section 360bbb-3(b)(1), unless the authorization is terminated or revoked.  Performed at [facility], [address], [place], [place] [address]     Procedures and diagnostic studies:  DG Chest 2 View  Result Date:  08/06/2020 CLINICAL DATA:  Weakness. EXAM: CHEST - 2 VIEW COMPARISON:  July 02, 2020. FINDINGS: The heart size and mediastinal contours are within normal limits. Similar positioning of left subclavian approach cardiac rhythm maintenance device. No consolidation. Suspected right upper lung pulmonary nodule. No visible pleural effusions or pneumothorax. No acute osseous abnormality. Left shoulder degenerative change. IMPRESSION: 1. No acute cardiopulmonary disease. 2. Suspected right upper lung pulmonary nodule. Recommend chest CT for further evaluation. Electronically Signed   By: Feliberto Harts MD   On: 08/06/2020 15:09   CT HEAD WO CONTRAST  Result Date: 08/08/2020 CLINICAL DATA:  Acute neuro deficit.  Rule out stroke. EXAM: CT HEAD WITHOUT CONTRAST TECHNIQUE: Contiguous axial images were obtained from the base of the skull through the vertex without intravenous contrast. COMPARISON:  CT head 08/06/2020 FINDINGS: Brain: Diffuse cerebral atrophy and ventricular enlargement is stable. Moderate white matter changes with patchy periventricular deep white matter bilaterally, stable. Negative for acute infarct, hemorrhage, mass. Vascular: Negative for hyperdense vessel Skull: Negative Sinuses/Orbits: Paranasal sinuses clear.  No orbital lesion Other: None IMPRESSION: Atrophy and chronic microvascular ischemic change. No acute abnormality no change from prior CT. Electronically Signed   By: Marlan Palau M.D.   On: 08/08/2020 11:57   CT Head Wo Contrast  Result Date: 08/06/2020 CLINICAL DATA:  Altered mental status. Left lower extremity weakness EXAM: CT HEAD WITHOUT CONTRAST TECHNIQUE: Contiguous axial images were obtained from the base of the skull through the vertex without intravenous contrast. COMPARISON:  July 02, 2020 and April 17, 2018 FINDINGS: Brain: Diffuse atrophy is stable compared to prior studies. No intracranial mass, hemorrhage, extra-axial fluid collection, or midline shift. Patchy  small vessel disease throughout the centra semiovale bilaterally appear stable. No acute infarct is evident. Small vessel disease throughout the anterior limb of the right external capsule noted, stable. Vascular: No hyperdense vessel. No appreciable vascular calcification. Skull: Bony calvarium appears intact. Sinuses/Orbits: Visualized paranasal sinuses are clear. Visualized orbits appear symmetric bilaterally. Other: Visualized mastoid air cells are clear. IMPRESSION: Diffuse atrophy is stable. There  is stable small vessel disease in the centra semiovale bilaterally. Small vessel disease in the anterior limb of the right external capsule is likewise stable. No acute infarct. No mass or hemorrhage. Electronically Signed   By: Bretta Bang III M.D.   On: 08/06/2020 15:15   US Carotid Bilateral  Result Date: 08/07/2020 CLINICAL DATA:  TIA.  History of smoking. EXAM: BILATERAL CAROTID DUPLEX ULTRASOUND TECHNIQUE: Wallace Cullens scale imaging, color Doppler and duplex ultrasound were performed of bilateral carotid and vertebral arteries in the neck. COMPARISON:  Carotid ultrasound-03/20/2016 FINDINGS: Criteria: Quantification of carotid stenosis is based on velocity parameters that correlate the residual internal carotid diameter with NASCET-based stenosis levels, using the diameter of the distal internal carotid lumen as the denominator for stenosis measurement. The following velocity measurements were obtained: RIGHT ICA: 71/19 cm/sec CCA: 89/14 cm/sec SYSTOLIC ICA/CCA RATIO:  1.0 ECA: 60 cm/sec LEFT ICA: 72/19 cm/sec CCA: 72/11 cm/sec SYSTOLIC ICA/CCA RATIO:  1.3 ECA: 65 cm/sec RIGHT CAROTID ARTERY: There is a minimal amount intimal thickening/atherosclerotic plaque within the right carotid bulb (image 18), slightly progressed compared to the 2017 examination though not resulting in elevated peak systolic velocities within the interrogated course of the right internal carotid artery to suggest a hemodynamically  significant stenosis. RIGHT VERTEBRAL ARTERY:  Antegrade flow LEFT CAROTID ARTERY: There is no grayscale evidence of significant intimal thickening or atherosclerotic plaque affecting the interrogated portions of the left carotid system. There are no elevated peak systolic velocities within the interrogated course of the left internal carotid artery to suggest a hemodynamically significant stenosis. LEFT VERTEBRAL ARTERY:  Antegrade flow IMPRESSION: 1. Very minimal amount of right-sided intimal thickening/atherosclerotic plaque, slightly progressed compared to the 2017 examination, though not resulting in elevated peak systolic velocities within the right internal carotid artery. 2. Normal sonographic evaluation of the left carotid system. Electronically Signed   By: Simonne Come M.D.   On: 08/07/2020 07:33   ECHOCARDIOGRAM COMPLETE BUBBLE STUDY  Result Date: 08/07/2020    ECHOCARDIOGRAM REPORT   Patient Name:   JENSINE LUZ Date of Exam: 08/07/2020 Medical Rec #:  [id number]      Height:       66.0 in Accession #:    [id number]     Weight:       172.0 lb Date of Birth:  Sep 01, 1941       BSA:          1.875 m Patient Age:    79 years       BP:           107/62 mmHg Patient Gender: F              HR:           65 bpm. Exam Location:  [facility] Procedure: 2D Echo, Color Doppler, Cardiac Doppler and Saline Contrast Bubble            Study Indications:     TIA 435.9  History:         Patient has prior history of Echocardiogram examinations, most                  recent 03/20/2016. Pacemaker; Stroke.  Sonographer:     Cristela Blue RDCS (AE) Referring Phys:  [id number] ERIC CHEN Diagnosing Phys: Debbe Odea MD  Sonographer Comments: No apical window and Technically challenging study due to limited acoustic windows. Image acquisition challenging due to respiratory motion. IMPRESSIONS  1. Left ventricular ejection fraction, by estimation, is 50  to 55%. The left ventricle has low normal function. The left ventricle has no  regional wall motion abnormalities. Left ventricular diastolic function could not be evaluated.  2. Right ventricular systolic function is normal. The right ventricular size is normal.  3. The mitral valve is normal in structure. No evidence of mitral valve regurgitation.  4. The aortic valve is grossly normal. Aortic valve regurgitation is not visualized.  5. The inferior vena cava is normal in size with greater than 50% respiratory variability, suggesting right atrial pressure of 3 mmHg.  6. Agitated saline contrast bubble study was negative, with no evidence of any interatrial shunt. Conclusion(s)/Recommendation(s): No intracardiac source of embolism detected on this transthoracic study. A transesophageal echocardiogram is recommended to exclude cardiac source of embolism if clinically indicated. FINDINGS  Left Ventricle: Left ventricular ejection fraction, by estimation, is 50 to 55%. The left ventricle has low normal function. The left ventricle has no regional wall motion abnormalities. The left ventricular internal cavity size was normal in size. There is no left ventricular hypertrophy. Left ventricular diastolic function could not be evaluated. Right Ventricle: The right ventricular size is normal. No increase in right ventricular wall thickness. Right ventricular systolic function is normal. Left Atrium: Left atrial size was normal in size. Right Atrium: Right atrial size was normal in size. Pericardium: There is no evidence of pericardial effusion. Mitral Valve: The mitral valve is normal in structure. No evidence of mitral valve regurgitation. Tricuspid Valve: The tricuspid valve is normal in structure. Tricuspid valve regurgitation is not demonstrated. Aortic Valve: The aortic valve is grossly normal. Aortic valve regurgitation is not visualized. Pulmonic Valve: The pulmonic valve was not well visualized. Pulmonic valve regurgitation is not visualized. Aorta: The aortic root is normal in size and  structure. Venous: The inferior vena cava is normal in size with greater than 50% respiratory variability, suggesting right atrial pressure of 3 mmHg. IAS/Shunts: No atrial level shunt detected by color flow Doppler. Agitated saline contrast was given intravenously to evaluate for intracardiac shunting. Agitated saline contrast bubble study was negative, with no evidence of any interatrial shunt. Additional Comments: A pacer wire is visualized.  LEFT VENTRICLE PLAX 2D LVIDd:         4.19 cm LVIDs:         2.76 cm LV PW:         0.96 cm LV IVS:        0.91 cm LVOT diam:     2.10 cm LVOT Area:     3.46 cm  LEFT ATRIUM         Index LA diam:    3.60 cm 1.92 cm/m                        PULMONIC VALVE AORTA                 PV Vmax:        0.70 m/s Ao Root diam: 2.80 cm PV Peak grad:   2.0 mmHg                       RVOT Peak grad: 3 mmHg   SHUNTS Systemic Diam: 2.10 cm Debbe Odea MD Electronically signed by Debbe Odea MD Signature Date/Time: 08/07/2020/12:50:06 PM    Final                LOS: 1 day   Lurene Shadow  [facility]   Pager  on [email or web address]. If 7PM-7AM, please contact night-coverage at [email or web address]     08/08/2020, 12:33 PM

## 2020-08-09 DIAGNOSIS — R262 Difficulty in walking, not elsewhere classified: Secondary | ICD-10-CM | POA: Diagnosis not present

## 2020-08-09 MED ORDER — VITAMIN B-12 1000 MCG PO TABS
1000.0000 ug | ORAL_TABLET | Freq: Every day | ORAL | Status: DC
  Administered 2020-08-09 – 2020-08-14 (×6): 1000 ug via ORAL
  Filled 2020-08-09 (×6): qty 1

## 2020-08-09 NOTE — Progress Notes (Signed)
Physical Therapy Treatment Patient Details Name: Donna Dodson MRN: [id number] DOB: May 10, 1941 Today's Date: 08/09/2020    History of Present Illness Pt is 79 y/o F with PMH: PPM d/t SSS, stroke, mandibular abscess, and chronic tobacco abuse. Pt was just admitted in October 2021 d/t fall from bed with subsequent rhabdomyolysis. Pt brought in by EMS on this hospitalization, after being found on couch by son, covered in urine and feces with 2 day c/o LE weakness.    PT Comments    Pt ready for session.  To EOB with supervision.  Steady in sitting.  She is able to stand x 2 at EOB with min a x 1 and on second attempt, transfer to recliner.  Gait is generally unsteady and pt is aware of deficits.  Unsafe to progress gait away from bed/recliner given balance and general weakness.  She is able to march hesitantly in place for several steps with min a x 1.  Poor hand placements requiring cues for proper placement.  SNF remains appropriate for discharge.   Follow Up Recommendations  SNF     Equipment Recommendations  Rolling walker with 5" wheels    Recommendations for Other Services       Precautions / Restrictions Precautions Precautions: Fall Restrictions Weight Bearing Restrictions: No Other Position/Activity Restrictions: reports L LE and L hand numbness and confirmed on assessment    Mobility  Bed Mobility Overal bed mobility: Modified Independent                Transfers Overall transfer level: Needs assistance Equipment used: Rolling walker (2 wheeled) Transfers: Sit to/from Stand Sit to Stand: Min assist            Ambulation/Gait Ambulation/Gait assistance: Min Chemical engineer (Feet): 2 Feet Assistive device: Rolling walker (2 wheeled) Gait Pattern/deviations: Step-to pattern Gait velocity: decreased   General Gait Details: staggers a few steps to chair.  Reports feeling unsteady.   Stairs             Wheelchair Mobility    Modified  Rankin (Stroke Patients Only)       Balance Overall balance assessment: Needs assistance Sitting-balance support: Feet supported Sitting balance-Leahy Scale: Fair     Standing balance support: Bilateral upper extremity supported Standing balance-Leahy Scale: Poor Standing balance comment: generally unsteady with stepping requring hands on assist at all times.                            Cognition Arousal/Alertness: Awake/alert Behavior During Therapy: WFL for tasks assessed/performed Overall Cognitive Status: No family/caregiver present to determine baseline cognitive functioning                                        Exercises Other Exercises Other Exercises: marching in place with min assist for balance/safety    General Comments        Pertinent Vitals/Pain Pain Assessment: No/denies pain    Home Living                      Prior Function            PT Goals (current goals can now be found in the care plan section) Progress towards PT goals: Progressing toward goals    Frequency    Min 2X/week      PT  Plan Current plan remains appropriate    Co-evaluation              AM-PAC PT "6 Clicks" Mobility   Outcome Measure  Help needed turning from your back to your side while in a flat bed without using bedrails?: A Little Help needed moving from lying on your back to sitting on the side of a flat bed without using bedrails?: A Little Help needed moving to and from a bed to a chair (including a wheelchair)?: A Little Help needed standing up from a chair using your arms (e.g., wheelchair or bedside chair)?: A Little Help needed to walk in hospital room?: A Little Help needed climbing 3-5 steps with a railing? : A Lot 6 Click Score: 17    End of Session Equipment Utilized During Treatment: Gait belt Activity Tolerance: Patient tolerated treatment well;Patient limited by fatigue Patient left: in chair;with chair  alarm set;with call bell/phone within reach Nurse Communication: Mobility status       Time: 1001-1012 PT Time Calculation (min) (ACUTE ONLY): 11 min  Charges:  $Therapeutic Activity: 8-22 mins                   Danielle Dess, PTA 08/09/20, 10:19 AM

## 2020-08-09 NOTE — NC FL2 (Signed)
[place] MEDICAID FL2 LEVEL OF CARE SCREENING TOOL     IDENTIFICATION  Patient Name: Donna Dodson Birthdate: 02-Jul-1941 Sex: female Admission Date (Current Location): 08/06/2020  [place] and [place] Number:  Chiropodist and Address:  [facility], [address] [place], [place] [address]      Provider Number: [id number]  Attending Physician Name and Address:  Lurene Shadow, MD  Relative Name and Phone Number:  Fausto Skillern [phone]    Current Level of Care: Hospital Recommended Level of Care: Skilled Nursing Facility Prior Approval Number:    Date Approved/Denied:   PASRR Number: pending  Discharge Plan: SNF    Current Diagnoses: Patient Active Problem List   Diagnosis Date Noted  . Pressure injury of skin 08/07/2020  . Unable to walk 08/06/2020  . DNR (do not resuscitate) 08/06/2020  . History of stroke   . Fall at home, initial encounter   . Tobacco abuse   . Leukocytosis   . 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 S/P placement of cardiac pacemaker - Medtronic Adapta [id number], serial # [id number] 01/17/2013    Orientation RESPIRATION BLADDER Height & Weight     Self, Time, Situation, Place  Normal External catheter Weight: 78 kg Height:     BEHAVIORAL SYMPTOMS/MOOD NEUROLOGICAL BOWEL NUTRITION STATUS      Continent Diet (Regular)  AMBULATORY STATUS COMMUNICATION OF NEEDS Skin   Extensive Assist Verbally Normal                       Personal Care Assistance Level of Assistance  Bathing, Feeding, Dressing Bathing Assistance: Limited assistance Feeding assistance: Limited assistance Dressing Assistance: Limited assistance     Functional Limitations Info  Sight Sight Info: Impaired        SPECIAL CARE FACTORS FREQUENCY  PT (By licensed PT), OT (By licensed OT)     PT Frequency: 5 times per week OT Frequency: 5 times per week            Contractures Contractures Info: Not present    Additional Factors  Info  Code Status, Allergies Code Status Info: DNR Allergies Info: Sulfa Antibiotics           Current Medications (08/09/2020):  This is the current hospital active medication list Current Facility-Administered Medications  Medication Dose Route Frequency Provider Last Rate Last Admin  . acetaminophen (TYLENOL) tablet 650 mg  650 mg Oral Q6H PRN Carollee Herter, DO       Or  . acetaminophen (TYLENOL) suppository 650 mg  650 mg Rectal Q6H PRN Carollee Herter, DO      . aspirin EC tablet 81 mg  81 mg Oral Daily Carollee Herter, DO   81 mg at 08/09/20 0943  . enoxaparin (LOVENOX) injection 40 mg  40 mg Subcutaneous Q24H Carollee Herter, DO   40 mg at 08/08/20 0813  . nicotine (NICODERM CQ - dosed in mg/24 hours) patch 14 mg  14 mg Transdermal Daily Carollee Herter, DO   14 mg at 08/08/20 0817  . ondansetron (ZOFRAN) tablet 4 mg  4 mg Oral Q6H PRN Carollee Herter, DO       Or  . ondansetron [facility]) injection 4 mg  4 mg Intravenous Q6H PRN Carollee Herter, DO         Discharge Medications: Please see discharge summary for a list of discharge medications.  Relevant Imaging Results:  Relevant Lab Results:   Additional Information SS# [id number]  Allayne Butcher,  RN

## 2020-08-09 NOTE — Progress Notes (Signed)
Progress Note    Donna Dodson  FAO:[id number] DOB: 1941/07/20  DOA: 08/06/2020 PCP: Patient, No Pcp Per      Brief Narrative:    Medical records reviewed and are as summarized below:  Donna Dodson is a 79 y.o. female with medical history significant for sick sinus syndrome status post permanent pacemaker, dementia, tobacco use disorder, presented to the hospital because of weakness in the left leg and inability to walk.  Reportedly, EMS found to have a couch landing on feces and urine.  She lives alone at home.      Assessment/Plan:   Principal Problem:   Unable to walk Active Problems:   Tobacco abuse   S/P placement of cardiac pacemaker - Medtronic Adapta [id number], serial # [id number]   DNR (do not resuscitate)   Pressure injury of skin   Body mass index is 27.75 kg/m.     Inability to walk, left leg weakness: Initial CT head did not show any acute abnormality.  Repeat CT head on 08/08/2020 did not show any acute abnormality.  MRI cannot be done because she has a pacemaker and MRI compatibility cannot be verified.  Continue PT and OT.  PT and OT recommended discharge to SNF.  Awaiting placement to SNF.    Low normal vitamin B12.  Vitamin B12 level is 204.  Start vitamin B12 supplement.  Tobacco use disorder: Counseled to quit smoking cigarettes.  Dementia: Continue supportive care  Diet Order            Diet regular Room service appropriate? Yes; Fluid consistency: Thin  Diet effective now                    Consultants:  None  Procedures:  None    Medications:   . aspirin EC  81 mg Oral Daily  . enoxaparin (LOVENOX) injection  40 mg Subcutaneous Q24H  . nicotine  14 mg Transdermal Daily  . vitamin B-12  1,000 mcg Oral Daily   Continuous Infusions:   Anti-infectives (From admission, onward)   None             Family Communication/Anticipated D/C date and plan/Code Status   DVT prophylaxis: enoxaparin (LOVENOX) injection  40 mg Start: 08/06/20 2245 SCDs Start: 08/06/20 2240     Code Status: DNR  Family Communication: None Disposition Plan:    Status is: Inpatient  Remains inpatient appropriate because:Unsafe d/c plan   Dispo: The patient is from: SNF              Anticipated d/c is to: SNF              Anticipated d/c date is: 1 day              Patient currently is medically stable to d/c.           Subjective:   Interval events noted.  No new complaints.  She still feels she has weakness on the left leg although she can move it.  Objective:    Vitals:   08/08/20 2336 08/09/20 0442 08/09/20 0737 08/09/20 1125  BP: 98/64 120/75 114/73 106/63  Pulse: 62 62 78 60  Resp: Temp: 98.2 F (36.8 C) 98.3 F (36.8 C) 98.3 F (36.8 C) 98.3 F (36.8 C)  TempSrc: Oral Oral Oral   SpO2: 97% 94% 95% 96%  Weight:       No data found.   Intake/Output  Summary (Last 24 hours) at 08/09/2020 1214 Last data filed at 08/09/2020 1021 Gross per 24 hour  Intake 300 ml  Output --  Net 300 ml   Filed Weights   08/06/20 1443 08/08/20 1505  Weight: 78 kg 78 kg    Exam:  GEN: NAD SKIN: Warm and dry EYES: EOMI ENT: MMM CV: RRR PULM: CTA B ABD: soft, ND, NT, +BS CNS: AAO x 2 (person and place), non focal EXT: No edema or tenderness    Data Reviewed:   I have personally reviewed following labs and imaging studies:  Labs: Labs show the following:   Basic Metabolic Panel: Recent Labs  Lab 08/06/20 1447 08/07/20 0646  NA 138 136  K 4.0 3.7  CL 104 104  CO2 23 23  GLUCOSE 88 96  BUN 10 14  CREATININE 0.67 0.63  CALCIUM 9.2 9.1   GFR Estimated Creatinine Clearance: 62.1 mL/min (by C-G formula based on SCr of 0.63 mg/dL). Liver Function Tests: Recent Labs  Lab 08/06/20 1447 08/07/20 0646  AST 25 18  ALT 14 11  ALKPHOS 71 60  BILITOT 0.7 0.7  PROT 7.1 5.8*  ALBUMIN 3.9 3.2*   No results for input(s): LIPASE, AMYLASE in the last 168 hours. No results  for input(s): AMMONIA in the last 168 hours. Coagulation profile Recent Labs  Lab 08/06/20 1805  INR 1.0    CBC: Recent Labs  Lab 08/06/20 1447 08/07/20 0646  WBC 11.6* 8.8  NEUTROABS 9.9* 6.0  HGB 14.2 12.6  HCT 42.6 36.7  MCV 89.5 87.4  PLT 285 250   Cardiac Enzymes: Recent Labs  Lab 08/06/20 1447  CKTOTAL 112   BNP (last 3 results) No results for input(s): PROBNP in the last 8760 hours. CBG: No results for input(s): GLUCAP in the last 168 hours. D-Dimer: No results for input(s): DDIMER in the last 72 hours. Hgb A1c: No results for input(s): HGBA1C in the last 72 hours. Lipid Profile: Recent Labs    08/07/20 0646  CHOL 123  HDL 38*  LDLCALC 68  TRIG 85  CHOLHDL 3.2   Thyroid function studies: No results for input(s): TSH, T4TOTAL, T3FREE, THYROIDAB in the last 72 hours.  Invalid input(s): FREET3 Anemia work up: Recent Labs    08/08/20 0646  VITAMINB12 204   Sepsis Labs: Recent Labs  Lab 08/06/20 1447 08/06/20 1452 08/06/20 1805 08/07/20 0646  WBC 11.6*  --   --  8.8  LATICACIDVEN  --  1.5 1.0  --     Microbiology Recent Results (from the past 240 hour(s))  Resp Panel by RT-PCR (Flu A&B, Covid) Nasopharyngeal Swab     Status: None   Collection Time: 08/06/20  3:38 PM   Specimen: Nasopharyngeal Swab; Nasopharyngeal(NP) swabs in vial transport medium  Result Value Ref Range Status   SARS Coronavirus 2 by RT PCR NEGATIVE NEGATIVE Final    Comment: (NOTE) SARS-CoV-2 target nucleic acids are NOT DETECTED.  The SARS-CoV-2 RNA is generally detectable in upper respiratory specimens during the acute phase of infection. The lowest concentration of SARS-CoV-2 viral copies this assay can detect is 138 copies/mL. A negative result does not preclude SARS-Cov-2 infection and should not be used as the sole basis for treatment or other patient management decisions. A negative result may occur with  improper specimen collection/handling, submission of  specimen other than nasopharyngeal swab, presence of viral mutation(s) within the areas targeted by this assay, and inadequate number of viral copies(<138 copies/mL). A negative result  must be combined with clinical observations, patient history, and epidemiological information. The expected result is Negative.  Fact Sheet for Patients:  BloggerCourse.com  Fact Sheet for Healthcare Providers:  SeriousBroker.it  This test is no t yet approved or cleared by the [place] FDA and  has been authorized for detection and/or diagnosis of SARS-CoV-2 by FDA under an Emergency Use Authorization (EUA). This EUA will remain  in effect (meaning this test can be used) for the duration of the COVID-19 declaration under Section 564(b)(1) of the Act, 21 U.S.C.section 360bbb-3(b)(1), unless the authorization is terminated  or revoked sooner.       Influenza A by PCR NEGATIVE NEGATIVE Final   Influenza B by PCR NEGATIVE NEGATIVE Final    Comment: (NOTE) The Xpert Xpress SARS-CoV-2/FLU/RSV plus assay is intended as an aid in the diagnosis of influenza from Nasopharyngeal swab specimens and should not be used as a sole basis for treatment. Nasal washings and aspirates are unacceptable for Xpert Xpress SARS-CoV-2/FLU/RSV testing.  Fact Sheet for Patients: BloggerCourse.com  Fact Sheet for Healthcare Providers: SeriousBroker.it  This test is not yet approved or cleared by the [place] FDA and has been authorized for detection and/or diagnosis of SARS-CoV-2 by FDA under an Emergency Use Authorization (EUA). This EUA will remain in effect (meaning this test can be used) for the duration of the COVID-19 declaration under Section 564(b)(1) of the Act, 21 U.S.C. section 360bbb-3(b)(1), unless the authorization is terminated or revoked.  Performed at [facility], [address], [place], [place] [address]     Procedures and diagnostic studies:  CT HEAD WO CONTRAST  Result Date: 08/08/2020 CLINICAL DATA:  Acute neuro deficit.  Rule out stroke. EXAM: CT HEAD WITHOUT CONTRAST TECHNIQUE: Contiguous axial images were obtained from the base of the skull through the vertex without intravenous contrast. COMPARISON:  CT head 08/06/2020 FINDINGS: Brain: Diffuse cerebral atrophy and ventricular enlargement is stable. Moderate white matter changes with patchy periventricular deep white matter bilaterally, stable. Negative for acute infarct, hemorrhage, mass. Vascular: Negative for hyperdense vessel Skull: Negative Sinuses/Orbits: Paranasal sinuses clear.  No orbital lesion Other: None IMPRESSION: Atrophy and chronic microvascular ischemic change. No acute abnormality no change from prior CT. Electronically Signed   By: Marlan Palau M.D.   On: 08/08/2020 11:57   ECHOCARDIOGRAM COMPLETE BUBBLE STUDY  Result Date: 08/07/2020    ECHOCARDIOGRAM REPORT   Patient Name:   YULANDA DIGGS Date of Exam: 08/07/2020 Medical Rec #:  [id number]      Height:       66.0 in Accession #:    [id number]     Weight:       172.0 lb Date of Birth:  Nov 05, 1940       BSA:          1.875 m Patient Age:    79 years       BP:           107/62 mmHg Patient Gender: F              HR:           65 bpm. Exam Location:  [facility] Procedure: 2D Echo, Color Doppler, Cardiac Doppler and Saline Contrast Bubble            Study Indications:     TIA 435.9  History:         Patient has prior history of Echocardiogram examinations, most  recent 03/20/2016. Pacemaker; Stroke.  Sonographer:     Cristela Blue RDCS (AE) Referring Phys:  [id number] ERIC CHEN Diagnosing Phys: Debbe Odea MD  Sonographer Comments: No apical window and Technically challenging study due to limited acoustic windows. Image acquisition challenging due to respiratory motion. IMPRESSIONS  1. Left ventricular ejection fraction, by estimation, is 50 to  55%. The left ventricle has low normal function. The left ventricle has no regional wall motion abnormalities. Left ventricular diastolic function could not be evaluated.  2. Right ventricular systolic function is normal. The right ventricular size is normal.  3. The mitral valve is normal in structure. No evidence of mitral valve regurgitation.  4. The aortic valve is grossly normal. Aortic valve regurgitation is not visualized.  5. The inferior vena cava is normal in size with greater than 50% respiratory variability, suggesting right atrial pressure of 3 mmHg.  6. Agitated saline contrast bubble study was negative, with no evidence of any interatrial shunt. Conclusion(s)/Recommendation(s): No intracardiac source of embolism detected on this transthoracic study. A transesophageal echocardiogram is recommended to exclude cardiac source of embolism if clinically indicated. FINDINGS  Left Ventricle: Left ventricular ejection fraction, by estimation, is 50 to 55%. The left ventricle has low normal function. The left ventricle has no regional wall motion abnormalities. The left ventricular internal cavity size was normal in size. There is no left ventricular hypertrophy. Left ventricular diastolic function could not be evaluated. Right Ventricle: The right ventricular size is normal. No increase in right ventricular wall thickness. Right ventricular systolic function is normal. Left Atrium: Left atrial size was normal in size. Right Atrium: Right atrial size was normal in size. Pericardium: There is no evidence of pericardial effusion. Mitral Valve: The mitral valve is normal in structure. No evidence of mitral valve regurgitation. Tricuspid Valve: The tricuspid valve is normal in structure. Tricuspid valve regurgitation is not demonstrated. Aortic Valve: The aortic valve is grossly normal. Aortic valve regurgitation is not visualized. Pulmonic Valve: The pulmonic valve was not well visualized. Pulmonic valve  regurgitation is not visualized. Aorta: The aortic root is normal in size and structure. Venous: The inferior vena cava is normal in size with greater than 50% respiratory variability, suggesting right atrial pressure of 3 mmHg. IAS/Shunts: No atrial level shunt detected by color flow Doppler. Agitated saline contrast was given intravenously to evaluate for intracardiac shunting. Agitated saline contrast bubble study was negative, with no evidence of any interatrial shunt. Additional Comments: A pacer wire is visualized.  LEFT VENTRICLE PLAX 2D LVIDd:         4.19 cm LVIDs:         2.76 cm LV PW:         0.96 cm LV IVS:        0.91 cm LVOT diam:     2.10 cm LVOT Area:     3.46 cm  LEFT ATRIUM         Index LA diam:    3.60 cm 1.92 cm/m                        PULMONIC VALVE AORTA                 PV Vmax:        0.70 m/s Ao Root diam: 2.80 cm PV Peak grad:   2.0 mmHg  RVOT Peak grad: 3 mmHg   SHUNTS Systemic Diam: 2.10 cm Debbe Odea MD Electronically signed by Debbe Odea MD Signature Date/Time: 08/07/2020/12:50:06 PM    Final                LOS: 2 days   Estee Yohe  [facility]   Pager on [email or web address]. If 7PM-7AM, please contact night-coverage at [email or web address]     08/09/2020, 12:14 PM

## 2020-08-09 NOTE — TOC Initial Note (Signed)
Transition of Care [facility]) - Initial/Assessment Note    Patient Details  Name: Donna Dodson MRN: [id number] Date of Birth: 23-Apr-1941  [facility] CM/SW Contact:    Shelbie Hutching, RN Phone Number: 08/09/2020, 10:16 AM  Clinical Narrative:                 Patient admitted to the hospital with left leg weakness and pain and TIA.  RNCM met with patient at the bedside this morning.  Patient is sitting up eating breakfast.  Patient reports that she lives alone and her son, Shanon Brow, checks in on her.  Patient has a walker at home that she can usually get around with.  Patient's son provides any transportation and does all of her grocery shopping.  Patient has a brother that lives in [place].   PT has worked with patient and is recommending SNF.  Patient agrees to go to rehab and is okay going to [facility], which is where she went back in October.  SNF workup started.   Expected Discharge Plan: Skilled Nursing Facility Barriers to Discharge: Continued Medical Work up   Patient Goals and CMS Choice Patient states their goals for this hospitalization and ongoing recovery are:: Patient is okay with going for rehab CMS Medicare.gov Compare Post Acute Care list provided to:: Patient Choice offered to / list presented to : Patient  Expected Discharge Plan and Services Expected Discharge Plan: [place]   Discharge Planning Services: CM Consult Post Acute Care Choice: [place] Living arrangements for the past 2 months: Apartment                 DME Arranged: N/A DME Agency: NA       HH Arranged: NA          Prior Living Arrangements/Services Living arrangements for the past 2 months: Apartment Lives with:: Self Patient language and need for interpreter reviewed:: Yes Do you feel safe going back to the place where you live?: Yes      Need for Family Participation in Patient Care: Yes (Comment) (weakness) Care giver support system in place?: Yes  (comment) (son) Current home services: DME (walker) Criminal Activity/Legal Involvement Pertinent to Current Situation/Hospitalization: No - Comment as needed  Activities of Daily Living Home Assistive Devices/Equipment: None ADL Screening (condition at time of admission) Patient's cognitive ability adequate to safely complete daily activities?: Yes Is the patient deaf or have difficulty hearing?: Yes Does the patient have difficulty seeing, even when wearing glasses/contacts?: Yes Does the patient have difficulty concentrating, remembering, or making decisions?: Yes Patient able to express need for assistance with ADLs?: Yes Does the patient have difficulty dressing or bathing?: No Independently performs ADLs?: Yes (appropriate for developmental age) Does the patient have difficulty walking or climbing stairs?: Yes Weakness of Legs: Both Weakness of Arms/Hands: None  Permission Sought/Granted Permission sought to share information with : Case Manager, Family Supports, Chartered certified accountant granted to share information with : Yes, Verbal Permission Granted  Share Information with NAME: Shanon Brow  Permission granted to share info w AGENCY: [facility]  Permission granted to share info w Relationship: son     Emotional Assessment Appearance:: Appears stated age Attitude/Demeanor/Rapport: Engaged Affect (typically observed): Accepting Orientation: : Oriented to Self, Oriented to Place, Oriented to  Time, Oriented to Situation Alcohol / Substance Use: Not Applicable Psych Involvement: No (comment)  Admission diagnosis:  TIA (transient ischemic attack) [G45.9] Unable to walk [R26.2] Left leg weakness [  R29.898] Patient Active Problem List   Diagnosis Date Noted  . Pressure injury of skin 08/07/2020  . Unable to walk 08/06/2020  . DNR (do not resuscitate) 08/06/2020  . History of stroke   . Fall at home, initial encounter   . Tobacco abuse   . Leukocytosis    . 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 S/P placement of cardiac pacemaker - Medtronic Adapta [id number], serial # [id number] 01/17/2013   PCP:  Patient, No Pcp Per Pharmacy:   [id number] [place], [id number]: [phone]: [phone]    Social Determinants of Health (SDOH) Interventions    Readmission Risk Interventions No flowsheet data found.

## 2020-08-09 NOTE — TOC Progression Note (Signed)
Transition of Care [facility]) - Progression Note    Patient Details  Name: Donna Dodson MRN: [id number] Date of Birth: [birth year]  [facility] CM/SW Contact  Allayne Butcher, RN Phone Number: 08/09/2020, 4:14 PM  Clinical Narrative:    [facility] notified this RN that patient is in her copay days.  RNCM will have to follow up with patient and family to see if they can afford the copay.     Expected Discharge Plan: Skilled Nursing Facility Barriers to Discharge: Continued Medical Work up  Expected Discharge Plan and Services Expected Discharge Plan: Skilled Nursing Facility   Discharge Planning Services: CM Consult Post Acute Care Choice: Skilled Nursing Facility Living arrangements for the past 2 months: Apartment                 DME Arranged: N/A DME Agency: NA       HH Arranged: NA           Social Determinants of Health (SDOH) Interventions    Readmission Risk Interventions No flowsheet data found.

## 2020-08-10 ENCOUNTER — Encounter: Payer: Self-pay | Admitting: Internal Medicine

## 2020-08-10 DIAGNOSIS — Z950 Presence of cardiac pacemaker: Secondary | ICD-10-CM | POA: Diagnosis not present

## 2020-08-10 DIAGNOSIS — R262 Difficulty in walking, not elsewhere classified: Secondary | ICD-10-CM | POA: Diagnosis not present

## 2020-08-10 DIAGNOSIS — Z720 Tobacco use: Secondary | ICD-10-CM | POA: Diagnosis not present

## 2020-08-10 NOTE — Progress Notes (Addendum)
Progress Note    Donna Dodson  [id number] DOB: Oct 21, 1940  DOA: 08/06/2020 PCP: Patient, No Pcp Per      Brief Narrative:    Medical records reviewed and are as summarized below:  Donna Dodson is a 79 y.o. female with medical history significant for sick sinus syndrome status post permanent pacemaker, dementia, tobacco use disorder, presented to the hospital because of weakness in the left leg and inability to walk.  Reportedly, EMS found to have a couch landing on feces and urine.  She lives alone at home.      Assessment/Plan:   Principal Problem:   Unable to walk Active Problems:   Tobacco abuse   S/P placement of cardiac pacemaker - Medtronic Adapta [id number], serial # [id number]   DNR (do not resuscitate)   Pressure injury of skin   Body mass index is 27.75 kg/m.     Inability to walk, left leg weakness: Initial CT head did not show any acute abnormality.  Repeat CT head on 08/08/2020 did not show any acute abnormality but she has evidence of chronic microvascular ischemic changes.  MRI cannot be done because she has a pacemaker and MRI compatibility cannot be verified.  Continue aspirin.  Continue PT and OT.  Discharge to SNF.  Awaiting placement to SNF.  Low normal vitamin B12.  Vitamin B12 level is 204.  Continue vitamin B12 supplement  Tobacco use disorder: Counseled to quit smoking cigarettes.  Dementia: Continue supportive care  History of sick sinus syndrome s/p permanent pacemaker  Diet Order            Diet regular Room service appropriate? Yes; Fluid consistency: Thin  Diet effective now                    Consultants:  None  Procedures:  None    Medications:   . aspirin EC  81 mg Oral Daily  . enoxaparin (LOVENOX) injection  40 mg Subcutaneous Q24H  . nicotine  14 mg Transdermal Daily  . vitamin B-12  1,000 mcg Oral Daily   Continuous Infusions:   Anti-infectives (From admission, onward)   None              Family Communication/Anticipated D/C date and plan/Code Status   DVT prophylaxis: enoxaparin (LOVENOX) injection 40 mg Start: 08/06/20 2245 SCDs Start: 08/06/20 2240     Code Status: DNR  Family Communication: None Disposition Plan:    Status is: Inpatient  Remains inpatient appropriate because:Unsafe d/c plan   Dispo: The patient is from: SNF              Anticipated d/c is to: SNF              Anticipated d/c date is: 1 day              Patient currently is medically stable to d/c.           Subjective:   Interval events noted.  No new complaints.  She still unable to walk without assistance.  She is wondering when she is going to be discharged.  Objective:    Vitals:   08/09/20 1929 08/09/20 2315 08/10/20 0340 08/10/20 0756  BP: 111/89 100/69 119/75 112/76  Pulse: 70 66 68 64  Resp: 18 16 16 18   Temp: 97.8 F (36.6 C) 97.8 F (36.6 C) 98.6 F (37 C) 98.4 F (36.9 C)  TempSrc:      SpO2:  100% 98% 99% 97%  Weight:       No data found.   Intake/Output Summary (Last 24 hours) at 08/10/2020 1209 Last data filed at 08/10/2020 1017 Gross per 24 hour  Intake 600 ml  Output 1425 ml  Net -825 ml   Filed Weights   08/06/20 1443 08/08/20 1505  Weight: 78 kg 78 kg    Exam:  GEN: NAD SKIN: Warm and dry EYES: No pallor or icterus ENT: MMM CV: RRR PULM: CTA B ABD: soft, ND, NT, +BS CNS: AAO x 2 (person and place), non focal EXT: No edema or tenderness      Data Reviewed:   I have personally reviewed following labs and imaging studies:  Labs: Labs show the following:   Basic Metabolic Panel: Recent Labs  Lab 08/06/20 1447 08/07/20 0646  NA 138 136  K 4.0 3.7  CL 104 104  CO2 23 23  GLUCOSE 88 96  BUN 10 14  CREATININE 0.67 0.63  CALCIUM 9.2 9.1   GFR Estimated Creatinine Clearance: 62.1 mL/min (by C-G formula based on SCr of 0.63 mg/dL). Liver Function Tests: Recent Labs  Lab 08/06/20 1447 08/07/20 0646   AST 25 18  ALT 14 11  ALKPHOS 71 60  BILITOT 0.7 0.7  PROT 7.1 5.8*  ALBUMIN 3.9 3.2*   No results for input(s): LIPASE, AMYLASE in the last 168 hours. No results for input(s): AMMONIA in the last 168 hours. Coagulation profile Recent Labs  Lab 08/06/20 1805  INR 1.0    CBC: Recent Labs  Lab 08/06/20 1447 08/07/20 0646  WBC 11.6* 8.8  NEUTROABS 9.9* 6.0  HGB 14.2 12.6  HCT 42.6 36.7  MCV 89.5 87.4  PLT 285 250   Cardiac Enzymes: Recent Labs  Lab 08/06/20 1447  CKTOTAL 112   BNP (last 3 results) No results for input(s): PROBNP in the last 8760 hours. CBG: No results for input(s): GLUCAP in the last 168 hours. D-Dimer: No results for input(s): DDIMER in the last 72 hours. Hgb A1c: No results for input(s): HGBA1C in the last 72 hours. Lipid Profile: No results for input(s): CHOL, HDL, LDLCALC, TRIG, CHOLHDL, LDLDIRECT in the last 72 hours. Thyroid function studies: No results for input(s): TSH, T4TOTAL, T3FREE, THYROIDAB in the last 72 hours.  Invalid input(s): FREET3 Anemia work up: Recent Labs    08/08/20 0646  VITAMINB12 204   Sepsis Labs: Recent Labs  Lab 08/06/20 1447 08/06/20 1452 08/06/20 1805 08/07/20 0646  WBC 11.6*  --   --  8.8  LATICACIDVEN  --  1.5 1.0  --     Microbiology Recent Results (from the past 240 hour(s))  Resp Panel by RT-PCR (Flu A&B, Covid) Nasopharyngeal Swab     Status: None   Collection Time: 08/06/20  3:38 PM   Specimen: Nasopharyngeal Swab; Nasopharyngeal(NP) swabs in vial transport medium  Result Value Ref Range Status   SARS Coronavirus 2 by RT PCR NEGATIVE NEGATIVE Final    Comment: (NOTE) SARS-CoV-2 target nucleic acids are NOT DETECTED.  The SARS-CoV-2 RNA is generally detectable in upper respiratory specimens during the acute phase of infection. The lowest concentration of SARS-CoV-2 viral copies this assay can detect is 138 copies/mL. A negative result does not preclude SARS-Cov-2 infection and should  not be used as the sole basis for treatment or other patient management decisions. A negative result may occur with  improper specimen collection/handling, submission of specimen other than nasopharyngeal swab, presence of viral mutation(s) within the  areas targeted by this assay, and inadequate number of viral copies(<138 copies/mL). A negative result must be combined with clinical observations, patient history, and epidemiological information. The expected result is Negative.  Fact Sheet for Patients:  BloggerCourse.com  Fact Sheet for Healthcare Providers:  SeriousBroker.it  This test is no t yet approved or cleared by the [place] FDA and  has been authorized for detection and/or diagnosis of SARS-CoV-2 by FDA under an Emergency Use Authorization (EUA). This EUA will remain  in effect (meaning this test can be used) for the duration of the COVID-19 declaration under Section 564(b)(1) of the Act, 21 U.S.C.section 360bbb-3(b)(1), unless the authorization is terminated  or revoked sooner.       Influenza A by PCR NEGATIVE NEGATIVE Final   Influenza B by PCR NEGATIVE NEGATIVE Final    Comment: (NOTE) The Xpert Xpress SARS-CoV-2/FLU/RSV plus assay is intended as an aid in the diagnosis of influenza from Nasopharyngeal swab specimens and should not be used as a sole basis for treatment. Nasal washings and aspirates are unacceptable for Xpert Xpress SARS-CoV-2/FLU/RSV testing.  Fact Sheet for Patients: BloggerCourse.com  Fact Sheet for Healthcare Providers: SeriousBroker.it  This test is not yet approved or cleared by the [place] FDA and has been authorized for detection and/or diagnosis of SARS-CoV-2 by FDA under an Emergency Use Authorization (EUA). This EUA will remain in effect (meaning this test can be used) for the duration of the COVID-19 declaration under  Section 564(b)(1) of the Act, 21 U.S.C. section 360bbb-3(b)(1), unless the authorization is terminated or revoked.  Performed at [facility], [address] [place], [place] [address]     Procedures and diagnostic studies:  No results found.             LOS: 3 days   Greer Koeppen  [facility]   Pager on [email or web address]. If 7PM-7AM, please contact night-coverage at [email or web address]     08/10/2020, 12:09 PM

## 2020-08-10 NOTE — Progress Notes (Signed)
OT Cancellation Note  Patient Details Name: Donna Dodson MRN: [id number] DOB: 1941/07/24   Cancelled Treatment:    Reason Eval/Treat Not Completed: Patient declined, no reason specified. Upon attempt, pt eating breakfast, HOB semi reclined. Declined repositioning for safety/ improved access to meal tray. Pt agreeable to OT re-attempt at later date/time.   Richrd Prime, MPH, MS, OTR/L ascom [phone] 08/10/20, 10:15 AM

## 2020-08-10 NOTE — Progress Notes (Signed)
RE: Donna Dodson Date of Birth: 02-19-1941 Date: 08/10/20   To Whom It May Concern:  Please be advised that the above-named patient will require a short-term nursing home stay - anticipated 30 days or less for rehabilitation and strengthening.  The plan is for return home.

## 2020-08-10 NOTE — TOC Progression Note (Signed)
Transition of Care [facility]) - Progression Note    Patient Details  Name: Donna Dodson MRN: [id number] Date of Birth: [birth year]  [facility] CM/SW Contact  Allayne Butcher, RN Phone Number: 08/10/2020, 1:18 PM  Clinical Narrative:    Patient's son, Donna Dodson, at the bedside visiting with patient.  Financial counselor had called and spoken with son about starting Medicaid application.  Financial counselor has gotten patient to sign forms needed to start application.   Donna Dodson made aware that patient is in her copay days, he says that will be fine they can take care of it.  Donna Dodson does not want his mother to return home, he is looking into ALF and has been talking with [facility].   RNCM will reach out to [facility] to see if they will offer a bed.    Expected Discharge Plan: Skilled Nursing Facility Barriers to Discharge: Continued Medical Work up  Expected Discharge Plan and Services Expected Discharge Plan: Skilled Nursing Facility   Discharge Planning Services: CM Consult Post Acute Care Choice: Skilled Nursing Facility Living arrangements for the past 2 months: Apartment                 DME Arranged: N/A DME Agency: NA       HH Arranged: NA           Social Determinants of Health (SDOH) Interventions    Readmission Risk Interventions No flowsheet data found.

## 2020-08-10 NOTE — Care Management Important Message (Signed)
Important Message  Patient Details  Name: BROOKELLE PELLICANE MRN: [id number] Date of Birth: 10-02-1940   Medicare Important Message Given:  Yes     Olegario Messier A Lonnetta Kniskern 08/10/2020, 11:05 AM

## 2020-08-10 NOTE — TOC Progression Note (Addendum)
Transition of Care [facility]) - Progression Note    Patient Details  Name: Donna Dodson MRN: [id number] Date of Birth: 02/01/1941  Transition of Care [facility]) CM/SW Contact  Liliana Cline, LCSW Phone Number: 08/10/2020, 4:11 PM  Clinical Narrative:   Started insurance authorization on [employer]. Chris at [facility] reported they can take patient on Monday. Uploaded additional information to [facility] for PASRR Screening.    Expected Discharge Plan: Skilled Nursing Facility Barriers to Discharge: Continued Medical Work up  Expected Discharge Plan and Services Expected Discharge Plan: Skilled Nursing Facility   Discharge Planning Services: CM Consult Post Acute Care Choice: Skilled Nursing Facility Living arrangements for the past 2 months: Apartment                 DME Arranged: N/A DME Agency: NA       HH Arranged: NA           Social Determinants of Health (SDOH) Interventions    Readmission Risk Interventions No flowsheet data found.

## 2020-08-11 DIAGNOSIS — R262 Difficulty in walking, not elsewhere classified: Secondary | ICD-10-CM | POA: Diagnosis not present

## 2020-08-11 MED ORDER — TUBERCULIN PPD 5 UNIT/0.1ML ID SOLN
5.0000 [IU] | Freq: Once | INTRADERMAL | Status: AC
  Administered 2020-08-11: 5 [IU] via INTRADERMAL
  Filled 2020-08-11: qty 0.1

## 2020-08-11 NOTE — Progress Notes (Signed)
Progress Note    Donna Dodson  [id number]:[id number] DOB: [birth year]  DOA: 08/06/2020 PCP: Patient, No Pcp Per      Brief Narrative:    Medical records reviewed and are as summarized below:  Donna Dodson is a 79 y.o. female with medical history significant for sick sinus syndrome status post permanent pacemaker, dementia, tobacco use disorder, presented to the hospital because of weakness in the left leg and inability to walk.  Reportedly, EMS found to have a couch landing on feces and urine.  She lives alone at home.      Assessment/Plan:   Principal Problem:   Unable to walk Active Problems:   Tobacco abuse   S/P placement of cardiac pacemaker - Medtronic Adapta [id number], serial # [id number]   DNR (do not resuscitate)   Pressure injury of skin   Body mass index is 27.75 kg/m.     Inability to walk, left leg weakness: Initial CT head did not show any acute abnormality.  Repeat CT head on 08/08/2020 did not show any acute abnormality but she has evidence of chronic microvascular ischemic changes.  MRI cannot be done because she has a pacemaker and MRI compatibility cannot be verified.  Continue aspirin.  Continue PT and OT.  Awaiting placement to SNF.  Her son requested TB screening with PPD and tuberculin skin test was administered today..  Low normal vitamin B12.  Vitamin B12 level is 204.  Continue vitamin B12 supplement  Tobacco use disorder: Counseled to quit smoking cigarettes.  Dementia: Continue supportive care  History of sick sinus syndrome s/p permanent pacemaker  Diet Order            Diet regular Room service appropriate? Yes; Fluid consistency: Thin  Diet effective now                    Consultants:  None  Procedures:  None    Medications:   . aspirin EC  81 mg Oral Daily  . enoxaparin (LOVENOX) injection  40 mg Subcutaneous Q24H  . nicotine  14 mg Transdermal Daily  . tuberculin  5 Units Intradermal Once  . vitamin B-12  1,000  mcg Oral Daily   Continuous Infusions:   Anti-infectives (From admission, onward)   None             Family Communication/Anticipated D/C date and plan/Code Status   DVT prophylaxis: enoxaparin (LOVENOX) injection 40 mg Start: 08/06/20 2245 SCDs Start: 08/06/20 2240     Code Status: DNR  Family Communication: None Disposition Plan:    Status is: Inpatient  Remains inpatient appropriate because:Unsafe d/c plan   Dispo: The patient is from: SNF              Anticipated d/c is to: SNF              Anticipated d/c date is: 1 day              Patient currently is medically stable to d/c.           Subjective:   Interval events noted.  No complaints.  She thinks weakness in the left leg is better.  Objective:    Vitals:   08/10/20 2342 08/11/20 0328 08/11/20 0921 08/11/20 1059  BP: 119/69 (!) 98/59 121/77 (!) 101/57  Pulse: 66 82 62 62  Resp: 16 16 16 16   Temp: 98.2 F (36.8 C) (!) 97.5 F (36.4 C) 97.8 F (36.6 C)  98.6 F (37 C)  TempSrc: Oral Oral Oral Oral  SpO2: 95% 99% 100% 98%  Weight:       No data found.   Intake/Output Summary (Last 24 hours) at 08/11/2020 1119 Last data filed at 08/11/2020 0933 Gross per 24 hour  Intake --  Output 1950 ml  Net -1950 ml   Filed Weights   08/06/20 1443 08/08/20 1505  Weight: 78 kg 78 kg    Exam:  GEN: NAD SKIN: Warm and dry EYES: EOMI ENT: MMM CV: RRR PULM: CTA B ABD: soft, ND, NT, +BS CNS: AAO x 3, non focal EXT: No edema or tenderness       Data Reviewed:   I have personally reviewed following labs and imaging studies:  Labs: Labs show the following:   Basic Metabolic Panel: Recent Labs  Lab 08/06/20 1447 08/07/20 0646  NA 138 136  K 4.0 3.7  CL 104 104  CO2 23 23  GLUCOSE 88 96  BUN 10 14  CREATININE 0.67 0.63  CALCIUM 9.2 9.1   GFR Estimated Creatinine Clearance: 62.1 mL/min (by C-G formula based on SCr of 0.63 mg/dL). Liver Function Tests: Recent Labs   Lab 08/06/20 1447 08/07/20 0646  AST 25 18  ALT 14 11  ALKPHOS 71 60  BILITOT 0.7 0.7  PROT 7.1 5.8*  ALBUMIN 3.9 3.2*   No results for input(s): LIPASE, AMYLASE in the last 168 hours. No results for input(s): AMMONIA in the last 168 hours. Coagulation profile Recent Labs  Lab 08/06/20 1805  INR 1.0    CBC: Recent Labs  Lab 08/06/20 1447 08/07/20 0646  WBC 11.6* 8.8  NEUTROABS 9.9* 6.0  HGB 14.2 12.6  HCT 42.6 36.7  MCV 89.5 87.4  PLT 285 250   Cardiac Enzymes: Recent Labs  Lab 08/06/20 1447  CKTOTAL 112   BNP (last 3 results) No results for input(s): PROBNP in the last 8760 hours. CBG: No results for input(s): GLUCAP in the last 168 hours. D-Dimer: No results for input(s): DDIMER in the last 72 hours. Hgb A1c: No results for input(s): HGBA1C in the last 72 hours. Lipid Profile: No results for input(s): CHOL, HDL, LDLCALC, TRIG, CHOLHDL, LDLDIRECT in the last 72 hours. Thyroid function studies: No results for input(s): TSH, T4TOTAL, T3FREE, THYROIDAB in the last 72 hours.  Invalid input(s): FREET3 Anemia work up: No results for input(s): VITAMINB12, FOLATE, FERRITIN, TIBC, IRON, RETICCTPCT in the last 72 hours. Sepsis Labs: Recent Labs  Lab 08/06/20 1447 08/06/20 1452 08/06/20 1805 08/07/20 0646  WBC 11.6*  --   --  8.8  LATICACIDVEN  --  1.5 1.0  --     Microbiology Recent Results (from the past 240 hour(s))  Resp Panel by RT-PCR (Flu A&B, Covid) Nasopharyngeal Swab     Status: None   Collection Time: 08/06/20  3:38 PM   Specimen: Nasopharyngeal Swab; Nasopharyngeal(NP) swabs in vial transport medium  Result Value Ref Range Status   SARS Coronavirus 2 by RT PCR NEGATIVE NEGATIVE Final    Comment: (NOTE) SARS-CoV-2 target nucleic acids are NOT DETECTED.  The SARS-CoV-2 RNA is generally detectable in upper respiratory specimens during the acute phase of infection. The lowest concentration of SARS-CoV-2 viral copies this assay can detect  is 138 copies/mL. A negative result does not preclude SARS-Cov-2 infection and should not be used as the sole basis for treatment or other patient management decisions. A negative result may occur with  improper specimen collection/handling, submission of specimen other  than nasopharyngeal swab, presence of viral mutation(s) within the areas targeted by this assay, and inadequate number of viral copies(<138 copies/mL). A negative result must be combined with clinical observations, patient history, and epidemiological information. The expected result is Negative.  Fact Sheet for Patients:  BloggerCourse.com  Fact Sheet for Healthcare Providers:  SeriousBroker.it  This test is no t yet approved or cleared by the [place] FDA and  has been authorized for detection and/or diagnosis of SARS-CoV-2 by FDA under an Emergency Use Authorization (EUA). This EUA will remain  in effect (meaning this test can be used) for the duration of the COVID-19 declaration under Section 564(b)(1) of the Act, 21 U.S.C.section 360bbb-3(b)(1), unless the authorization is terminated  or revoked sooner.       Influenza A by PCR NEGATIVE NEGATIVE Final   Influenza B by PCR NEGATIVE NEGATIVE Final    Comment: (NOTE) The Xpert Xpress SARS-CoV-2/FLU/RSV plus assay is intended as an aid in the diagnosis of influenza from Nasopharyngeal swab specimens and should not be used as a sole basis for treatment. Nasal washings and aspirates are unacceptable for Xpert Xpress SARS-CoV-2/FLU/RSV testing.  Fact Sheet for Patients: BloggerCourse.com  Fact Sheet for Healthcare Providers: SeriousBroker.it  This test is not yet approved or cleared by the [place] FDA and has been authorized for detection and/or diagnosis of SARS-CoV-2 by FDA under an Emergency Use Authorization (EUA). This EUA will remain in effect  (meaning this test can be used) for the duration of the COVID-19 declaration under Section 564(b)(1) of the Act, 21 U.S.C. section 360bbb-3(b)(1), unless the authorization is terminated or revoked.  Performed at [facility], [address] [place], [place] [address]     Procedures and diagnostic studies:  No results found.             LOS: 4 days   Donna Dodson  [facility]   Pager on [email or web address]. If 7PM-7AM, please contact night-coverage at [email or web address]     08/11/2020, 11:19 AM

## 2020-08-12 DIAGNOSIS — R262 Difficulty in walking, not elsewhere classified: Secondary | ICD-10-CM | POA: Diagnosis not present

## 2020-08-12 NOTE — Progress Notes (Addendum)
Progress Note    Donna Dodson  LOV:[id number] DOB: 28-Jun-1941  DOA: 08/06/2020 PCP: Patient, No Pcp Per      Brief Narrative:    Medical records reviewed and are as summarized below:  Donna Dodson is a 79 y.o. female with medical history significant for sick sinus syndrome status post permanent pacemaker, dementia, tobacco use disorder, presented to the hospital because of weakness in the left leg and inability to walk.  Reportedly, EMS found to have a couch landing on feces and urine.  She lives alone at home.      Assessment/Plan:   Principal Problem:   Unable to walk Active Problems:   Tobacco abuse   S/P placement of cardiac pacemaker - Medtronic Adapta [id number], serial # [id number]   DNR (do not resuscitate)   Pressure injury of skin   Body mass index is 27.75 kg/m.     Inability to walk, left leg weakness: Initial CT head did not show any acute abnormality.  Repeat CT head on 08/08/2020 did not show any acute abnormality but she has evidence of chronic microvascular ischemic changes.  MRI cannot be done because she has a pacemaker and MRI compatibility cannot be verified.  Continue aspirin.  Continue PT and OT.  Awaiting placement to SNF.  Follow-up with social worker to assist with disposition.  Tuberculin skin test was performed on 08/11/2020 at 11:07 AM.  Monitor for skin induration.  Low normal vitamin B12.  Vitamin B12 level is 204.  Continue vitamin B12 supplement  Tobacco use disorder: Counseled to quit smoking cigarettes.  Dementia: Continue supportive care  History of sick sinus syndrome s/p permanent pacemaker  Stage II left buttock decubitus ulcer (present on admission): Continue local wound care.   Pressure Injury 08/07/20 Buttocks Left Stage 2 -  Partial thickness loss of dermis presenting as a shallow open injury with a red, pink wound bed without slough. small openened, pink area (Active)  08/07/20 0415  Location: Buttocks  Location  Orientation: Left  Staging: Stage 2 -  Partial thickness loss of dermis presenting as a shallow open injury with a red, pink wound bed without slough.  Wound Description (Comments): small openened, pink area  Present on Admission: Yes          Diet Order            Diet regular Room service appropriate? Yes; Fluid consistency: Thin  Diet effective now                    Consultants:  None  Procedures:  None    Medications:   . aspirin EC  81 mg Oral Daily  . enoxaparin (LOVENOX) injection  40 mg Subcutaneous Q24H  . nicotine  14 mg Transdermal Daily  . tuberculin  5 Units Intradermal Once  . vitamin B-12  1,000 mcg Oral Daily   Continuous Infusions:   Anti-infectives (From admission, onward)   None             Family Communication/Anticipated D/C date and plan/Code Status   DVT prophylaxis: enoxaparin (LOVENOX) injection 40 mg Start: 08/06/20 2245 SCDs Start: 08/06/20 2240     Code Status: DNR  Family Communication: None Disposition Plan:    Status is: Inpatient  Remains inpatient appropriate because:Unsafe d/c plan   Dispo: The patient is from: SNF              Anticipated d/c is to: SNF  Anticipated d/c date is: 1 day              Patient currently is medically stable to d/c.           Subjective:   Interval events noted.  No new complaints.  Weakness in left leg is better.  Objective:    Vitals:   08/11/20 2041 08/11/20 2350 08/12/20 0430 08/12/20 0743  BP: 109/67 104/68 96/66 106/73  Pulse: 66 61 64 66  Resp: 18 17 18 17   Temp: 97.9 F (36.6 C) 97.9 F (36.6 C) 98.5 F (36.9 C) 98.2 F (36.8 C)  TempSrc:    Oral  SpO2: 98% 97% 97% 99%  Weight:       No data found.   Intake/Output Summary (Last 24 hours) at 08/12/2020 1127 Last data filed at 08/12/2020 1100 Gross per 24 hour  Intake --  Output 1300 ml  Net -1300 ml   Filed Weights   08/06/20 1443 08/08/20 1505  Weight: 78 kg 78 kg     Exam:  GEN: NAD SKIN: Stage II left buttock decubitus ulcer.  No induration or erythema PPD test site on the left proximal anterior forearm EYES: No pallor or icterus ENT: MMM CV: RRR PULM: CTA B ABD: soft, ND, NT, +BS CNS: AAO x 3, non focal EXT: No edema or tenderness       Data Reviewed:   I have personally reviewed following labs and imaging studies:  Labs: Labs show the following:   Basic Metabolic Panel: Recent Labs  Lab 08/06/20 1447 08/07/20 0646  NA 138 136  K 4.0 3.7  CL 104 104  CO2 23 23  GLUCOSE 88 96  BUN 10 14  CREATININE 0.67 0.63  CALCIUM 9.2 9.1   GFR Estimated Creatinine Clearance: 62.1 mL/min (by C-G formula based on SCr of 0.63 mg/dL). Liver Function Tests: Recent Labs  Lab 08/06/20 1447 08/07/20 0646  AST 25 18  ALT 14 11  ALKPHOS 71 60  BILITOT 0.7 0.7  PROT 7.1 5.8*  ALBUMIN 3.9 3.2*   No results for input(s): LIPASE, AMYLASE in the last 168 hours. No results for input(s): AMMONIA in the last 168 hours. Coagulation profile Recent Labs  Lab 08/06/20 1805  INR 1.0    CBC: Recent Labs  Lab 08/06/20 1447 08/07/20 0646  WBC 11.6* 8.8  NEUTROABS 9.9* 6.0  HGB 14.2 12.6  HCT 42.6 36.7  MCV 89.5 87.4  PLT 285 250   Cardiac Enzymes: Recent Labs  Lab 08/06/20 1447  CKTOTAL 112   BNP (last 3 results) No results for input(s): PROBNP in the last 8760 hours. CBG: No results for input(s): GLUCAP in the last 168 hours. D-Dimer: No results for input(s): DDIMER in the last 72 hours. Hgb A1c: No results for input(s): HGBA1C in the last 72 hours. Lipid Profile: No results for input(s): CHOL, HDL, LDLCALC, TRIG, CHOLHDL, LDLDIRECT in the last 72 hours. Thyroid function studies: No results for input(s): TSH, T4TOTAL, T3FREE, THYROIDAB in the last 72 hours.  Invalid input(s): FREET3 Anemia work up: No results for input(s): VITAMINB12, FOLATE, FERRITIN, TIBC, IRON, RETICCTPCT in the last 72 hours. Sepsis  Labs: Recent Labs  Lab 08/06/20 1447 08/06/20 1452 08/06/20 1805 08/07/20 0646  WBC 11.6*  --   --  8.8  LATICACIDVEN  --  1.5 1.0  --     Microbiology Recent Results (from the past 240 hour(s))  Resp Panel by RT-PCR (Flu A&B, Covid) Nasopharyngeal Swab  Status: None   Collection Time: 08/06/20  3:38 PM   Specimen: Nasopharyngeal Swab; Nasopharyngeal(NP) swabs in vial transport medium  Result Value Ref Range Status   SARS Coronavirus 2 by RT PCR NEGATIVE NEGATIVE Final    Comment: (NOTE) SARS-CoV-2 target nucleic acids are NOT DETECTED.  The SARS-CoV-2 RNA is generally detectable in upper respiratory specimens during the acute phase of infection. The lowest concentration of SARS-CoV-2 viral copies this assay can detect is 138 copies/mL. A negative result does not preclude SARS-Cov-2 infection and should not be used as the sole basis for treatment or other patient management decisions. A negative result may occur with  improper specimen collection/handling, submission of specimen other than nasopharyngeal swab, presence of viral mutation(s) within the areas targeted by this assay, and inadequate number of viral copies(<138 copies/mL). A negative result must be combined with clinical observations, patient history, and epidemiological information. The expected result is Negative.  Fact Sheet for Patients:  BloggerCourse.com  Fact Sheet for Healthcare Providers:  SeriousBroker.it  This test is no t yet approved or cleared by the [place] FDA and  has been authorized for detection and/or diagnosis of SARS-CoV-2 by FDA under an Emergency Use Authorization (EUA). This EUA will remain  in effect (meaning this test can be used) for the duration of the COVID-19 declaration under Section 564(b)(1) of the Act, 21 U.S.C.section 360bbb-3(b)(1), unless the authorization is terminated  or revoked sooner.       Influenza A by  PCR NEGATIVE NEGATIVE Final   Influenza B by PCR NEGATIVE NEGATIVE Final    Comment: (NOTE) The Xpert Xpress SARS-CoV-2/FLU/RSV plus assay is intended as an aid in the diagnosis of influenza from Nasopharyngeal swab specimens and should not be used as a sole basis for treatment. Nasal washings and aspirates are unacceptable for Xpert Xpress SARS-CoV-2/FLU/RSV testing.  Fact Sheet for Patients: BloggerCourse.com  Fact Sheet for Healthcare Providers: SeriousBroker.it  This test is not yet approved or cleared by the [place] FDA and has been authorized for detection and/or diagnosis of SARS-CoV-2 by FDA under an Emergency Use Authorization (EUA). This EUA will remain in effect (meaning this test can be used) for the duration of the COVID-19 declaration under Section 564(b)(1) of the Act, 21 U.S.C. section 360bbb-3(b)(1), unless the authorization is terminated or revoked.  Performed at [facility], [address] [place], [place] [address]     Procedures and diagnostic studies:  No results found.             LOS: 5 days   Gift Rueckert  [facility]   Pager on [email or web address]. If 7PM-7AM, please contact night-coverage at [email or web address]     08/12/2020, 11:27 AM

## 2020-08-13 DIAGNOSIS — R262 Difficulty in walking, not elsewhere classified: Secondary | ICD-10-CM | POA: Diagnosis not present

## 2020-08-13 NOTE — Care Management Important Message (Signed)
Important Message  Patient Details  Name: Donna Dodson MRN: [id number] Date of Birth: 03-05-1941   Medicare Important Message Given:  Yes     Johnell Comings 08/13/2020, 11:51 AM

## 2020-08-13 NOTE — Progress Notes (Signed)
Progress Note    Donna Dodson  WJX:[id number] DOB: 12-Jan-1941  DOA: 08/06/2020 PCP: Patient, No Pcp Per      Brief Narrative:    Medical records reviewed and are as summarized below:  Donna Dodson is a 79 y.o. female with medical history significant for sick sinus syndrome status post permanent pacemaker, dementia, tobacco use disorder, presented to the hospital because of weakness in the left leg and inability to walk.  Reportedly, EMS found to have a couch landing on feces and urine.   11/29 and 12/1 Head CT showed no evidence of stroke.  We were unable to get MRI due to pacemaker incompatibility. 11/30 Carotid Ultrasound showed minimal atherosclerotic plaque on right side.  Discharge is pending placement to SNF.    Assessment/Plan:   Principal Problem:   Unable to walk Active Problems:   Tobacco abuse   S/P placement of cardiac pacemaker - Medtronic Adapta [id number], serial # [id number]   DNR (do not resuscitate)   Pressure injury of skin   Body mass index is 27.75 kg/m.     Inability to walk, left leg weakness:  - Continue aspirin.   - Continue PT and OT.   - Awaiting placement to SNF.  Follow-up with social worker to assist with disposition, appreciate.  Tuberculin skin test was performed on 08/11/2020 at 11:07 AM.  Monitor for skin induration.  Low normal vitamin B12.  Vitamin B12 level is 204.   - Continue vitamin B12 supplement  Tobacco use disorder:  - Counseled to quit smoking cigarettes.  Dementia:  - Continue supportive care  History of sick sinus syndrome s/p permanent pacemaker  Stage II left buttock decubitus ulcer (present on admission):  - Continue local wound care.   Pressure Injury 08/07/20 Buttocks Left Stage 2 -  Partial thickness loss of dermis presenting as a shallow open injury with a red, pink wound bed without slough. small openened, pink area (Active)  08/07/20 0415  Location: Buttocks  Location Orientation: Left   Staging: Stage 2 -  Partial thickness loss of dermis presenting as a shallow open injury with a red, pink wound bed without slough.  Wound Description (Comments): small openened, pink area  Present on Admission: Yes     Diet Order            Diet regular Room service appropriate? Yes; Fluid consistency: Thin  Diet effective now                 Consultants:  None  Procedures:  None    Medications:   . aspirin EC  81 mg Oral Daily  . enoxaparin (LOVENOX) injection  40 mg Subcutaneous Q24H  . nicotine  14 mg Transdermal Daily  . vitamin B-12  1,000 mcg Oral Daily   Continuous Infusions:   Anti-infectives (From admission, onward)   None       Family Communication/Anticipated D/C date and plan/Code Status   DVT prophylaxis: enoxaparin (LOVENOX) injection 40 mg Start: 08/06/20 2245 SCDs Start: 08/06/20 2240     Code Status: DNR  Family Communication: None Disposition Plan:    Status is: Inpatient  Remains inpatient appropriate because:Unsafe d/c plan   Dispo: The patient is from: SNF              Anticipated d/c is to: SNF              Anticipated d/c date is: 1 day  Patient currently is medically stable to d/c.    Subjective:   Interval events noted.  States she feels ok.  Weakness in left leg is better.  Objective:    Vitals:   08/13/20 0553 08/13/20 0821 08/13/20 1028 08/13/20 1116  BP: 109/77 113/78  104/64  Pulse: 65 80  63  Resp: 16 18  16   Temp: (!) 97.5 F (36.4 C) 97.9 F (36.6 C)  97.9 F (36.6 C)  TempSrc:  Oral    SpO2: 97% 100%  96%  Weight:      Height:   5\' 6"  (1.676 m)    Orthostatic VS for the past 24 hrs:  BP- Lying Pulse- Lying BP- Sitting Pulse- Sitting BP- Standing at 0 minutes Pulse- Standing at 0 minutes  08/13/20 1029 105/62 62 120/69 73 107/73 71     Intake/Output Summary (Last 24 hours) at 08/13/2020 1554 Last data filed at 08/13/2020 1300 Gross per 24 hour  Intake 240 ml  Output 500 ml   Net -260 ml   Filed Weights   08/06/20 1443 08/08/20 1505  Weight: 78 kg 78 kg    Exam:  GEN: NAD SKIN: Stage II left buttock decubitus ulcer.  No induration or erythema. PPD test site on the left proximal anterior forearm EYES: No pallor or icterus ENT: MMM CV: RRR PULM: CTA B ABD: soft, ND, NT, +BS CNS: AAO x 3, no focal deficits. EXT: venous stasis.  No calf swelling or tenderness.    Data Reviewed:   I have personally reviewed following labs and imaging studies:  Labs: Labs show the following:   Basic Metabolic Panel: Recent Labs  Lab 08/07/20 0646  NA 136  K 3.7  CL 104  CO2 23  GLUCOSE 96  BUN 14  CREATININE 0.63  CALCIUM 9.1   GFR Estimated Creatinine Clearance: 62.1 mL/min (by C-G formula based on SCr of 0.63 mg/dL). Liver Function Tests: Recent Labs  Lab 08/07/20 0646  AST 18  ALT 11  ALKPHOS 60  BILITOT 0.7  PROT 5.8*  ALBUMIN 3.2*   Coagulation profile Recent Labs  Lab 08/06/20 1805  INR 1.0    CBC: Recent Labs  Lab 08/07/20 0646  WBC 8.8  NEUTROABS 6.0  HGB 12.6  HCT 36.7  MCV 87.4  PLT 250   Sepsis Labs: Recent Labs  Lab 08/06/20 1805 08/07/20 0646  WBC  --  8.8  LATICACIDVEN 1.0  --     Microbiology Recent Results (from the past 240 hour(s))  Resp Panel by RT-PCR (Flu A&B, Covid) Nasopharyngeal Swab     Status: None   Collection Time: 08/06/20  3:38 PM   Specimen: Nasopharyngeal Swab; Nasopharyngeal(NP) swabs in vial transport medium  Result Value Ref Range Status   SARS Coronavirus 2 by RT PCR NEGATIVE NEGATIVE Final    Comment: (NOTE) SARS-CoV-2 target nucleic acids are NOT DETECTED.  The SARS-CoV-2 RNA is generally detectable in upper respiratory specimens during the acute phase of infection. The lowest concentration of SARS-CoV-2 viral copies this assay can detect is 138 copies/mL. A negative result does not preclude SARS-Cov-2 infection and should not be used as the sole basis for treatment or other  patient management decisions. A negative result may occur with  improper specimen collection/handling, submission of specimen other than nasopharyngeal swab, presence of viral mutation(s) within the areas targeted by this assay, and inadequate number of viral copies(<138 copies/mL). A negative result must be combined with clinical observations, patient history, and epidemiological information.  The expected result is Negative.  Fact Sheet for Patients:  BloggerCourse.com  Fact Sheet for Healthcare Providers:  SeriousBroker.it  This test is no t yet approved or cleared by the [place] FDA and  has been authorized for detection and/or diagnosis of SARS-CoV-2 by FDA under an Emergency Use Authorization (EUA). This EUA will remain  in effect (meaning this test can be used) for the duration of the COVID-19 declaration under Section 564(b)(1) of the Act, 21 U.S.C.section 360bbb-3(b)(1), unless the authorization is terminated  or revoked sooner.       Influenza A by PCR NEGATIVE NEGATIVE Final   Influenza B by PCR NEGATIVE NEGATIVE Final    Comment: (NOTE) The Xpert Xpress SARS-CoV-2/FLU/RSV plus assay is intended as an aid in the diagnosis of influenza from Nasopharyngeal swab specimens and should not be used as a sole basis for treatment. Nasal washings and aspirates are unacceptable for Xpert Xpress SARS-CoV-2/FLU/RSV testing.  Fact Sheet for Patients: BloggerCourse.com  Fact Sheet for Healthcare Providers: SeriousBroker.it  This test is not yet approved or cleared by the [place] FDA and has been authorized for detection and/or diagnosis of SARS-CoV-2 by FDA under an Emergency Use Authorization (EUA). This EUA will remain in effect (meaning this test can be used) for the duration of the COVID-19 declaration under Section 564(b)(1) of the Act, 21 U.S.C. section  360bbb-3(b)(1), unless the authorization is terminated or revoked.  Performed at [facility], [address] [place], [place] [address]     Procedures and diagnostic studies:  No results found.    LOS: 6 days   Baldwin Jamaica  [facility]   Pager on [email or web address]. If 7PM-7AM, please contact night-coverage at [email or web address]   08/13/2020, 3:54 PM

## 2020-08-13 NOTE — Progress Notes (Signed)
Occupational Therapy Treatment Patient Details Name: Donna Dodson MRN: [id number] DOB: Feb 16, 1941 Today's Date: 08/13/2020    History of present illness Pt is 79 y/o F with PMH: PPM d/t SSS, stroke, mandibular abscess, and chronic tobacco abuse. Pt was just admitted in October 2021 d/t fall from bed with subsequent rhabdomyolysis. Pt brought in by EMS on this hospitalization, after being found on couch by son, covered in urine and feces with 2 day c/o LE weakness.   OT comments  Pt seen for OT Tx this date to f/u re: safety with ADLs/ADL mobility. OT engages pt in bathing with SETUP for seated UB and MIN A for transfer to standing with RW as well as MIN A for standing balance for LB dressing. Pt requires multimodal cues for sequencing throughout session. Appears slightly more confused today. Will continue to follow. Continue to anticipate pt will require STR upon d/c from acute setting.   Follow Up Recommendations  SNF    Equipment Recommendations  3 in 1 bedside commode;Other (comment) (2ww)    Recommendations for Other Services      Precautions / Restrictions Precautions Precautions: None;ICD/Pacemaker Restrictions Weight Bearing Restrictions: No Other Position/Activity Restrictions: reports L LE and L hand numbness and confirmed on assessment       Mobility Bed Mobility Overal bed mobility: Needs Assistance Bed Mobility: Supine to Sit     Supine to sit: Supervision     General bed mobility comments: pt up to chair pre/post session  Transfers Overall transfer level: Needs assistance Equipment used: Rolling walker (2 wheeled) Transfers: Sit to/from Stand Sit to Stand: Min assist         General transfer comment: verbal/tactile cues for hand placement    Balance Overall balance assessment: Needs assistance Sitting-balance support: No upper extremity supported;Feet supported Sitting balance-Leahy Scale: Good Sitting balance - Comments: G static sitting    Standing balance support: Bilateral upper extremity supported Standing balance-Leahy Scale: Poor Standing balance comment: requires UE support and external support                           ADL either performed or assessed with clinical judgement   ADL                                         General ADL Comments: Pt requires SETUP for seated UB bathing/dressing as well as verbal/tactile cues for sequencing task. Pt requires MIN A for sit<>stand with RW and MIN A for standing balance while performing LB bathing.     Vision Patient Visual Report: No change from baseline     Perception     Praxis      Cognition Arousal/Alertness: Awake/alert Behavior During Therapy: WFL for tasks assessed/performed Overall Cognitive Status: No family/caregiver present to determine baseline cognitive functioning                                 General Comments: Pt seems somewhat more confused today. Participates in bath with OT and CNA reports she'd already bathed this AM. Pt requires increased cues for sequencing.        Exercises Other Exercises Other Exercises: OT engages pt in bathing with SETUP for seated UB and MIN A for transfer to standing with RW as well as MIN A  for standing balance for LB dressing. Pt requires multimodal cues for sequencing throughout session. Appears slightly more confused today. Other Exercises: Unsupported sitting, very limited functional reach (approx 4" from immediate BOS); very fearful of anterior weight translation due to fall history Other Exercises: Orthostatic assessment-see vitals flowsheet for results.  Mild drop in BP, but sustains with accommodation to position; asymptomatic throughout.   Shoulder Instructions       General Comments      Pertinent Vitals/ Pain       Pain Assessment: No/denies pain  Home Living                                          Prior Functioning/Environment               Frequency  Min 1X/week        Progress Toward Goals  OT Goals(current goals can now be found in the care plan section)  Progress towards OT goals: Progressing toward goals  Acute Rehab OT Goals Patient Stated Goal: to get stronger and better balance OT Goal Formulation: With patient Time For Goal Achievement: 08/21/20 Potential to Achieve Goals: Good  Plan Discharge plan remains appropriate    Co-evaluation                 AM-PAC OT "6 Clicks" Daily Activity     Outcome Measure   Help from another person eating meals?: None Help from another person taking care of personal grooming?: A Little Help from another person toileting, which includes using toliet, bedpan, or urinal?: A Lot Help from another person bathing (including washing, rinsing, drying)?: A Little Help from another person to put on and taking off regular upper body clothing?: A Little Help from another person to put on and taking off regular lower body clothing?: A Little 6 Click Score: 18    End of Session Equipment Utilized During Treatment: Gait belt;Rolling walker  OT Visit Diagnosis: Unsteadiness on feet (R26.81);Muscle weakness (generalized) (M62.81)   Activity Tolerance Patient tolerated treatment well   Patient Left in chair;with call bell/phone within reach;with chair alarm set   Nurse Communication Mobility status        Time: 0109-3235 OT Time Calculation (min): 28 min  Charges: OT General Charges $OT Visit: 1 Visit OT Treatments $Self Care/Home Management : 23-37 mins  Rejeana Brock, MS, OTR/L ascom [phone] 08/13/20, 11:02 AM

## 2020-08-13 NOTE — TOC Progression Note (Signed)
Transition of Care [facility]) - Progression Note    Patient Details  Name: Donna Dodson MRN: [id number] Date of Birth: 1940/11/07  [facility]) CM/SW Contact  Trenton Founds, RN Phone Number: 08/13/2020, 8:29 AM  Clinical Narrative:   [employer] insurance requesting updated PT/OT notes within last 48 hours to proceed with authorization. RNCM will fax once available.     Expected Discharge Plan: Skilled Nursing Facility Barriers to Discharge: Continued Medical Work up  Expected Discharge Plan and Services Expected Discharge Plan: Skilled Nursing Facility   Discharge Planning Services: CM Consult Post Acute Care Choice: Skilled Nursing Facility Living arrangements for the past 2 months: Apartment                 DME Arranged: N/A DME Agency: NA       HH Arranged: NA           Social Determinants of Health (SDOH) Interventions    Readmission Risk Interventions No flowsheet data found.

## 2020-08-13 NOTE — Progress Notes (Signed)
Physical Therapy Treatment Patient Details Name: Donna Dodson MRN: [id number] DOB: Oct 26, 1940 Today's Date: 08/13/2020    History of Present Illness Pt is 79 y/o F with PMH: PPM d/t SSS, stroke, mandibular abscess, and chronic tobacco abuse. Pt was just admitted in October 2021 d/t fall from bed with subsequent rhabdomyolysis. Pt brought in by EMS on this hospitalization, after being found on couch by son, covered in urine and feces with 2 day c/o LE weakness.    PT Comments    very short, choppy (near festinating) stepping pattern, improved temporarily with verbal cuing.  Min assist for walker position, management; tends to veer, bump obstacles in periphery at times.  Generally unsteady, requiring RW and +1 for optimal safety; however, improved from previous sessions  Orthostatics assessed during session-see vitals flowsheet for results.  Mild drop in BP, but sustains with accommodation to position; asymptomatic throughout.   Follow Up Recommendations  SNF     Equipment Recommendations  Rolling walker with 5" wheels    Recommendations for Other Services       Precautions / Restrictions Precautions Precautions: None;ICD/Pacemaker Restrictions Weight Bearing Restrictions: No    Mobility  Bed Mobility Overal bed mobility: Needs Assistance Bed Mobility: Supine to Sit     Supine to sit: Supervision     General bed mobility comments: increased time, elevated HOB and use of bedrails to complete  Transfers Overall transfer level: Needs assistance Equipment used: Rolling walker (2 wheeled) Transfers: Sit to/from Stand Sit to Stand: Min assist         General transfer comment: cuing for hand placement  Ambulation/Gait Ambulation/Gait assistance: Min assist Gait Distance (Feet): 30 Feet Assistive device: Rolling walker (2 wheeled)       General Gait Details: very short, choppy (near festinating) stepping pattern, improved temporarily with verbal cuing.  Min assist  for walker position, management; tends to veer, bump obstacles in periphery at times.  Generally unsteady, requiring RW and +1 for optimal safety; however, improved from previous sessions   Stairs             Wheelchair Mobility    Modified Rankin (Stroke Patients Only)       Balance Overall balance assessment: Needs assistance Sitting-balance support: No upper extremity supported;Feet supported Sitting balance-Leahy Scale: Good     Standing balance support: Bilateral upper extremity supported Standing balance-Leahy Scale: Poor                              Cognition Arousal/Alertness: Awake/alert Behavior During Therapy: WFL for tasks assessed/performed Overall Cognitive Status: Within Functional Limits for tasks assessed                                        Exercises Other Exercises Other Exercises: Rolling, mod indep with use of bedrails Other Exercises: Unsupported sitting, very limited functional reach (approx 4" from immediate BOS); very fearful of anterior weight translation due to fall history Other Exercises: Orthostatic assessment-see vitals flowsheet for results.  Mild drop in BP, but sustains with accommodation to position; asymptomatic throughout.    General Comments        Pertinent Vitals/Pain Pain Assessment: No/denies pain    Home Living                      Prior Function  PT Goals (current goals can now be found in the care plan section) Acute Rehab PT Goals Patient Stated Goal: to get stronger and better balance PT Goal Formulation: With patient Time For Goal Achievement: 08/21/20 Potential to Achieve Goals: Good Progress towards PT goals: Progressing toward goals    Frequency    Min 2X/week      PT Plan Current plan remains appropriate    Co-evaluation              AM-PAC PT "6 Clicks" Mobility   Outcome Measure  Help needed turning from your back to your side while  in a flat bed without using bedrails?: None Help needed moving from lying on your back to sitting on the side of a flat bed without using bedrails?: None Help needed moving to and from a bed to a chair (including a wheelchair)?: A Little Help needed standing up from a chair using your arms (e.g., wheelchair or bedside chair)?: A Little Help needed to walk in hospital room?: A Little Help needed climbing 3-5 steps with a railing? : A Little 6 Click Score: 20    End of Session Equipment Utilized During Treatment: Gait belt Activity Tolerance: Patient tolerated treatment well Patient left: in chair;with call bell/phone within reach (OT at bedside for session) Nurse Communication: Mobility status PT Visit Diagnosis: Muscle weakness (generalized) (M62.81);Difficulty in walking, not elsewhere classified (R26.2);Unsteadiness on feet (R26.81)     Time: 9702-6378 PT Time Calculation (min) (ACUTE ONLY): 24 min  Charges:  $Gait Training: 8-22 mins $Therapeutic Activity: 8-22 mins                     Rakeya Glab H. Manson Passey, PT, DPT, NCS 08/13/20, 10:34 AM [phone]

## 2020-08-14 DIAGNOSIS — R262 Difficulty in walking, not elsewhere classified: Secondary | ICD-10-CM | POA: Diagnosis not present

## 2020-08-14 LAB — RESP PANEL BY RT-PCR (FLU A&B, COVID) ARPGX2
Influenza A by PCR: NEGATIVE
Influenza B by PCR: NEGATIVE
SARS Coronavirus 2 by RT PCR: NEGATIVE

## 2020-08-14 MED ORDER — ASPIRIN 81 MG PO TBEC: 81.0000 mg | DELAYED_RELEASE_TABLET | Freq: Every day | ORAL | 11 refills | Status: AC

## 2020-08-14 MED ORDER — NICOTINE 14 MG/24HR TD PT24: 14.0000 mg | MEDICATED_PATCH | Freq: Every day | TRANSDERMAL | 0 refills | Status: AC

## 2020-08-14 MED ORDER — ATORVASTATIN CALCIUM 20 MG PO TABS: 20.0000 mg | ORAL_TABLET | Freq: Every day | ORAL | 11 refills | Status: AC

## 2020-08-14 MED ORDER — CYANOCOBALAMIN 1000 MCG PO TABS: 1000.0000 ug | ORAL_TABLET | Freq: Every day | ORAL | 3 refills | Status: AC

## 2020-08-14 MED ORDER — ACETAMINOPHEN 325 MG PO TABS: 650.0000 mg | ORAL_TABLET | Freq: Four times a day (QID) | ORAL | 0 refills | Status: AC | PRN

## 2020-08-14 NOTE — Progress Notes (Addendum)
Patient stable and ready for discharge to [facility]. No IV access, patient dressed by NT and belongings packed. DNR paper in discharge packet. Writer called report and spoke with receiving nurse Demetrice, RN and she verbalized report provided and had no questions however writer's contact number provided. Patient is transporting by EMS and patient's family aware. Patient going to room 608B.

## 2020-08-14 NOTE — TOC Progression Note (Signed)
Transition of Care [facility]) - Progression Note    Patient Details  Name: Donna Dodson MRN: [id number] Date of Birth: 03-Jun-1941  Transition of Care [facility]) CM/SW Contact  Trenton Founds, RN Phone Number: 08/14/2020, 8:16 AM  Clinical Narrative:   RNCM received insurance authorization for patient from [facility] with auth ID of [id number] and Navi reference number of [id number]. Berkley Harvey is good 12/6 and next review date is 12/8. RNCM has reached out to facility to see if they are ready for patient and will update.     Expected Discharge Plan: Skilled Nursing Facility Barriers to Discharge: Continued Medical Work up  Expected Discharge Plan and Services Expected Discharge Plan: Skilled Nursing Facility   Discharge Planning Services: CM Consult Post Acute Care Choice: Skilled Nursing Facility Living arrangements for the past 2 months: Apartment                 DME Arranged: N/A DME Agency: NA       HH Arranged: NA           Social Determinants of Health (SDOH) Interventions    Readmission Risk Interventions No flowsheet data found.

## 2020-08-14 NOTE — Progress Notes (Signed)
Physical Therapy Treatment Patient Details Name: Donna Dodson MRN: [id number] DOB: Oct 09, 1940 Today's Date: 08/14/2020    History of Present Illness Pt is 79 y/o F with PMH: PPM d/t SSS, stroke, mandibular abscess, and chronic tobacco abuse. Pt was just admitted in October 2021 d/t fall from bed with subsequent rhabdomyolysis. Pt brought in by EMS on this hospitalization, after being found on couch by son, covered in urine and feces with 2 day c/o LE weakness.    PT Comments    Pt seen this am, received in supine, incontinent of urine.  Pt agreed to PT services.  Supine to sit with HOB raised and use of side rail with SBA. Pt demonstrated good unsupported sitting balance at EOB. Sit to stand with MinA, Pt required CGA to complete gait training in room x 40 ft with support of RW.  Noted slight unsteadiness, yet no LOB. Pt educated on B LE basic LE strengthening in sitting. Pt left sitting upright in bedside chair with chair alarm on, call light in reach, and all needs met.  Pt will benefit from continued PT upon d/c with continued recommendations for SNF placement upon d/c due to living alone.  Follow Up Recommendations  SNF     Equipment Recommendations  Rolling walker with 5" wheels    Recommendations for Other Services       Precautions / Restrictions Precautions Precautions: None;ICD/Pacemaker Restrictions Weight Bearing Restrictions: No    Mobility  Bed Mobility Overal bed mobility: Needs Assistance Bed Mobility: Supine to Sit     Supine to sit: Supervision        Transfers Overall transfer level: Needs assistance Equipment used: Rolling walker (2 wheeled) Transfers: Sit to/from Stand Sit to Stand: Min guard;Min assist         General transfer comment: MinA to raise from lower surfaces  Ambulation/Gait Ambulation/Gait assistance: Min guard Gait Distance (Feet): 40 Feet Assistive device: Rolling walker (2 wheeled) Gait Pattern/deviations: Shuffle Gait  velocity: decreased   General Gait Details: very short, choppy (near festinating) stepping pattern, improved temporarily with verbal cuing.  Min assist for walker position, management; tends to veer, bump obstacles in periphery at times.  Generally unsteady, requiring RW and +1 for optimal safety; however, improved from previous sessions   Stairs             Wheelchair Mobility    Modified Rankin (Stroke Patients Only)       Balance Overall balance assessment: Needs assistance Sitting-balance support: No upper extremity supported;Feet supported Sitting balance-Leahy Scale: Good Sitting balance - Comments: G static sitting   Standing balance support: Bilateral upper extremity supported Standing balance-Leahy Scale: Fair Standing balance comment: requires UE support and external support                            Cognition Arousal/Alertness: Awake/alert Behavior During Therapy: WFL for tasks assessed/performed Overall Cognitive Status: No family/caregiver present to determine baseline cognitive functioning                                 General Comments: Pt very pleasant and agreeable to PT      Exercises General Exercises - Lower Extremity Ankle Circles/Pumps: AROM;15 reps;Both Long Arc Quad: AROM;10 reps;Seated;Both    General Comments        Pertinent Vitals/Pain      Home Living  Prior Function            PT Goals (current goals can now be found in the care plan section) Acute Rehab PT Goals Patient Stated Goal: to get stronger and better balance Progress towards PT goals: Progressing toward goals    Frequency    Min 2X/week      PT Plan Current plan remains appropriate    Co-evaluation              AM-PAC PT "6 Clicks" Mobility   Outcome Measure  Help needed turning from your back to your side while in a flat bed without using bedrails?: None Help needed moving from lying on  your back to sitting on the side of a flat bed without using bedrails?: None Help needed moving to and from a bed to a chair (including a wheelchair)?: A Little Help needed standing up from a chair using your arms (e.g., wheelchair or bedside chair)?: A Little Help needed to walk in hospital room?: A Little Help needed climbing 3-5 steps with a railing? : A Little 6 Click Score: 20    End of Session Equipment Utilized During Treatment: Gait belt Activity Tolerance: Patient tolerated treatment well Patient left: in chair;with call bell/phone within reach Nurse Communication: Mobility status PT Visit Diagnosis: Muscle weakness (generalized) (M62.81);Difficulty in walking, not elsewhere classified (R26.2);Unsteadiness on feet (R26.81)     Time: 1035-1100 PT Time Calculation (min) (ACUTE ONLY): 25 min  Charges:  $Therapeutic Activity: 23-37 mins                     Mikel Cella, PTA   Josie Dixon 08/14/2020, 12:43 PM

## 2020-08-14 NOTE — Discharge Summary (Signed)
Physician Discharge Summary  Donna Dodson NGE:[id number] DOB: 1941/05/19 DOA: 08/06/2020  PCP: Patient, No Pcp Per  Admit date: 08/06/2020 Discharge date: 08/14/2020  Admitted From: Home Disposition: SNF  Recommendations for Outpatient Follow-up:  1. Follow up with PCP in 1-2 weeks 2. Continue physical therapy outpatient. 3. Continue local wound care with dressing changes q72 hours.   Discharge Condition: Stable  CODE STATUS: DNR  Diet recommendation: Mechanical Soft / Heart Healthy    Brief/Interim Summary: This is a 79 year old female with a PMH of Dementia, Chronic Tobacco Use, SSS s/p pacemaker placement, and degenerative disc disease who presented to the hospital due to chronic weakness and inability to ambulate.  She was found by son at home on the couch covered in urine and feces.  11/29 Head CT showed no evidence of stroke.  We were unable to get MRI due to pacemaker incompatibility. 11/30 Carotid Ultrasound showed minimal atherosclerotic plaque on right side. 12/1 Repeat Head CT showed no evidence of stroke.    Patient was evaluated by physical therapy throughout hospitalization.  She remained unsteady on her feet without loss of balance.  She has shown mild improvement during hospitalization but remains severely deconditioned.  She was discharged to a SNF with rolling walker for continued physical therapy.    She has a stage 2 pressure injury on the left ischium.  Local wound care will be managed by nursing facility.  She will receive silicone foam dressing changes q72hrs and monitoring of wounds qshift.  Patient was discharged on Aspirin 81 mg daily and Atorvastatin 20 mg daily.  She will continue B12 supplements.  She was also counseled on smoking cessation and was discharged with a nicotine patch.  Discharge Diagnoses:  Principal Problem:   Unable to walk Active Problems:   Tobacco abuse   S/P placement of cardiac pacemaker - [employer] Adapta [id number], serial #  [id number]   DNR (do not resuscitate)   Pressure injury of skin   Discharge Instructions  Discharge Instructions     Remove dressing in 72 hours   Complete by: As directed    Diet - low sodium heart healthy   Complete by: As directed    Discharge wound care:   Complete by: As directed    Foam dressing every three days.  Monitor wounds qshift.   Increase activity slowly   Complete by: As directed      Allergies as of 08/14/2020      Reactions   Sulfa Antibiotics Rash      Medication List    TAKE these medications   acetaminophen 325 MG tablet Commonly known as: TYLENOL Take 2 tablets (650 mg total) by mouth every 6 (six) hours as needed for mild pain (or Fever >/= 101).   aspirin 81 MG EC tablet Take 1 tablet (81 mg total) by mouth daily. Swallow whole. Start taking on: August 15, 2020   atorvastatin 20 MG tablet Commonly known as: Lipitor Take 1 tablet (20 mg total) by mouth daily.   cyanocobalamin 1000 MCG tablet Take 1 tablet (1,000 mcg total) by mouth daily. Start taking on: August 15, 2020   nicotine 14 mg/24hr patch Commonly known as: NICODERM CQ - dosed in mg/24 hours Place 1 patch (14 mg total) onto the skin daily. Start taking on: August 15, 2020            Discharge Care Instructions  (From admission, onward)         Start  Ordered   08/14/20 0000  Discharge wound care:       Comments: Foam dressing every three days.  Monitor wounds qshift.   08/14/20 1405          Allergies  Allergen Reactions  . Sulfa Antibiotics Rash    Consultations:  Wound Care   Procedures/Studies: DG Chest 2 View  Result Date: 08/06/2020 CLINICAL DATA:  Weakness. EXAM: CHEST - 2 VIEW COMPARISON:  July 02, 2020. FINDINGS: The heart size and mediastinal contours are within normal limits. Similar positioning of left subclavian approach cardiac rhythm maintenance device. No consolidation. Suspected right upper lung pulmonary nodule. No visible pleural  effusions or pneumothorax. No acute osseous abnormality. Left shoulder degenerative change. IMPRESSION: 1. No acute cardiopulmonary disease. 2. Suspected right upper lung pulmonary nodule. Recommend chest CT for further evaluation. Electronically Signed   By: Feliberto Harts MD   On: 08/06/2020 15:09   CT HEAD WO CONTRAST  Result Date: 08/08/2020 CLINICAL DATA:  Acute neuro deficit.  Rule out stroke. EXAM: CT HEAD WITHOUT CONTRAST TECHNIQUE: Contiguous axial images were obtained from the base of the skull through the vertex without intravenous contrast. COMPARISON:  CT head 08/06/2020 FINDINGS: Brain: Diffuse cerebral atrophy and ventricular enlargement is stable. Moderate white matter changes with patchy periventricular deep white matter bilaterally, stable. Negative for acute infarct, hemorrhage, mass. Vascular: Negative for hyperdense vessel Skull: Negative Sinuses/Orbits: Paranasal sinuses clear.  No orbital lesion Other: None IMPRESSION: Atrophy and chronic microvascular ischemic change. No acute abnormality no change from prior CT. Electronically Signed   By: Marlan Palau M.D.   On: 08/08/2020 11:57   CT Head Wo Contrast  Result Date: 08/06/2020 CLINICAL DATA:  Altered mental status. Left lower extremity weakness EXAM: CT HEAD WITHOUT CONTRAST TECHNIQUE: Contiguous axial images were obtained from the base of the skull through the vertex without intravenous contrast. COMPARISON:  July 02, 2020 and April 17, 2018 FINDINGS: Brain: Diffuse atrophy is stable compared to prior studies. No intracranial mass, hemorrhage, extra-axial fluid collection, or midline shift. Patchy small vessel disease throughout the centra semiovale bilaterally appear stable. No acute infarct is evident. Small vessel disease throughout the anterior limb of the right external capsule noted, stable. Vascular: No hyperdense vessel. No appreciable vascular calcification. Skull: Bony calvarium appears intact. Sinuses/Orbits:  Visualized paranasal sinuses are clear. Visualized orbits appear symmetric bilaterally. Other: Visualized mastoid air cells are clear. IMPRESSION: Diffuse atrophy is stable. There is stable small vessel disease in the centra semiovale bilaterally. Small vessel disease in the anterior limb of the right external capsule is likewise stable. No acute infarct. No mass or hemorrhage. Electronically Signed   By: Bretta Bang III M.D.   On: 08/06/2020 15:15   US Carotid Bilateral  Result Date: 08/07/2020 CLINICAL DATA:  TIA.  History of smoking. EXAM: BILATERAL CAROTID DUPLEX ULTRASOUND TECHNIQUE: Wallace Cullens scale imaging, color Doppler and duplex ultrasound were performed of bilateral carotid and vertebral arteries in the neck. COMPARISON:  Carotid ultrasound-03/20/2016 FINDINGS: Criteria: Quantification of carotid stenosis is based on velocity parameters that correlate the residual internal carotid diameter with NASCET-based stenosis levels, using the diameter of the distal internal carotid lumen as the denominator for stenosis measurement. The following velocity measurements were obtained: RIGHT ICA: 71/19 cm/sec CCA: 89/14 cm/sec SYSTOLIC ICA/CCA RATIO:  1.0 ECA: 60 cm/sec LEFT ICA: 72/19 cm/sec CCA: 72/11 cm/sec SYSTOLIC ICA/CCA RATIO:  1.3 ECA: 65 cm/sec RIGHT CAROTID ARTERY: There is a minimal amount intimal thickening/atherosclerotic plaque within the right carotid bulb (  image 18), slightly progressed compared to the 2017 examination though not resulting in elevated peak systolic velocities within the interrogated course of the right internal carotid artery to suggest a hemodynamically significant stenosis. RIGHT VERTEBRAL ARTERY:  Antegrade flow LEFT CAROTID ARTERY: There is no grayscale evidence of significant intimal thickening or atherosclerotic plaque affecting the interrogated portions of the left carotid system. There are no elevated peak systolic velocities within the interrogated course of the left  internal carotid artery to suggest a hemodynamically significant stenosis. LEFT VERTEBRAL ARTERY:  Antegrade flow IMPRESSION: 1. Very minimal amount of right-sided intimal thickening/atherosclerotic plaque, slightly progressed compared to the 2017 examination, though not resulting in elevated peak systolic velocities within the right internal carotid artery. 2. Normal sonographic evaluation of the left carotid system. Electronically Signed   By: Simonne Come M.D.   On: 08/07/2020 07:33   ECHOCARDIOGRAM COMPLETE BUBBLE STUDY  Result Date: 08/07/2020    ECHOCARDIOGRAM REPORT   Patient Name:   CHARNELE SEMPLE Date of Exam: 08/07/2020 Medical Rec #:  [id number]      Height:       66.0 in Accession #:    [id number]     Weight:       172.0 lb Date of Birth:  [birth year]       BSA:          1.875 m Patient Age:    79 years       BP:           107/62 mmHg Patient Gender: F              HR:           65 bpm. Exam Location:  [facility] Procedure: 2D Echo, Color Doppler, Cardiac Doppler and Saline Contrast Bubble            Study Indications:     TIA 435.9  History:         Patient has prior history of Echocardiogram examinations, most                  recent 03/20/2016. Pacemaker; Stroke.  Sonographer:     Cristela Blue RDCS (AE) Referring Phys:  [id number] ERIC CHEN Diagnosing Phys: Debbe Odea MD  Sonographer Comments: No apical window and Technically challenging study due to limited acoustic windows. Image acquisition challenging due to respiratory motion. IMPRESSIONS  1. Left ventricular ejection fraction, by estimation, is 50 to 55%. The left ventricle has low normal function. The left ventricle has no regional wall motion abnormalities. Left ventricular diastolic function could not be evaluated.  2. Right ventricular systolic function is normal. The right ventricular size is normal.  3. The mitral valve is normal in structure. No evidence of mitral valve regurgitation.  4. The aortic valve is grossly normal. Aortic valve  regurgitation is not visualized.  5. The inferior vena cava is normal in size with greater than 50% respiratory variability, suggesting right atrial pressure of 3 mmHg.  6. Agitated saline contrast bubble study was negative, with no evidence of any interatrial shunt. Conclusion(s)/Recommendation(s): No intracardiac source of embolism detected on this transthoracic study. A transesophageal echocardiogram is recommended to exclude cardiac source of embolism if clinically indicated. FINDINGS  Left Ventricle: Left ventricular ejection fraction, by estimation, is 50 to 55%. The left ventricle has low normal function. The left ventricle has no regional wall motion abnormalities. The left ventricular internal cavity size was normal in size. There is no left ventricular hypertrophy. Left ventricular  diastolic function could not be evaluated. Right Ventricle: The right ventricular size is normal. No increase in right ventricular wall thickness. Right ventricular systolic function is normal. Left Atrium: Left atrial size was normal in size. Right Atrium: Right atrial size was normal in size. Pericardium: There is no evidence of pericardial effusion. Mitral Valve: The mitral valve is normal in structure. No evidence of mitral valve regurgitation. Tricuspid Valve: The tricuspid valve is normal in structure. Tricuspid valve regurgitation is not demonstrated. Aortic Valve: The aortic valve is grossly normal. Aortic valve regurgitation is not visualized. Pulmonic Valve: The pulmonic valve was not well visualized. Pulmonic valve regurgitation is not visualized. Aorta: The aortic root is normal in size and structure. Venous: The inferior vena cava is normal in size with greater than 50% respiratory variability, suggesting right atrial pressure of 3 mmHg. IAS/Shunts: No atrial level shunt detected by color flow Doppler. Agitated saline contrast was given intravenously to evaluate for intracardiac shunting. Agitated saline contrast  bubble study was negative, with no evidence of any interatrial shunt. Additional Comments: A pacer wire is visualized.  LEFT VENTRICLE PLAX 2D LVIDd:         4.19 cm LVIDs:         2.76 cm LV PW:         0.96 cm LV IVS:        0.91 cm LVOT diam:     2.10 cm LVOT Area:     3.46 cm  LEFT ATRIUM         Index LA diam:    3.60 cm 1.92 cm/m                        PULMONIC VALVE AORTA                 PV Vmax:        0.70 m/s Ao Root diam: 2.80 cm PV Peak grad:   2.0 mmHg                       RVOT Peak grad: 3 mmHg   SHUNTS Systemic Diam: 2.10 cm Debbe Odea MD Electronically signed by Debbe Odea MD Signature Date/Time: 08/07/2020/12:50:06 PM    Final      Subjective:   Discharge Exam: Vitals:   08/14/20 0809 08/14/20 1138  BP: 117/73 91/71  Pulse: 66 67  Resp: 16 16  Temp: 97.7 F (36.5 C) 97.8 F (36.6 C)  SpO2: 100% 98%   Vitals:   08/14/20 0014 08/14/20 0427 08/14/20 0809 08/14/20 1138  BP: 116/75 118/74 117/73 91/71  Pulse: 60 66 66 67  Resp: 16 16 16 16   Temp: 99.3 F (37.4 C) (!) 97.3 F (36.3 C) 97.7 F (36.5 C) 97.8 F (36.6 C)  TempSrc: Oral  Oral Oral  SpO2: 99% 97% 100% 98%  Weight:      Height:        General: chronically ill appearing female, alert and oriented, no acute distress, physical deconditioning Cardiovascular: RRR, S1/S2 +, did not appreciate a murmur Respiratory: CTA bilaterally, no wheezing, no rhonchi Abdominal: Soft, NT, ND, bowel sounds + Extremities: no edema, no cyanosis,     The results of significant diagnostics from this hospitalization (including imaging, microbiology, ancillary and laboratory) are listed below for reference.     Microbiology: Recent Results (from the past 240 hour(s))  Resp Panel by RT-PCR (Flu A&B, Covid) Nasopharyngeal Swab  Status: None   Collection Time: 08/06/20  3:38 PM   Specimen: Nasopharyngeal Swab; Nasopharyngeal(NP) swabs in vial transport medium  Result Value Ref Range Status   SARS  Coronavirus 2 by RT PCR NEGATIVE NEGATIVE Final    Comment: (NOTE) SARS-CoV-2 target nucleic acids are NOT DETECTED.  The SARS-CoV-2 RNA is generally detectable in upper respiratory specimens during the acute phase of infection. The lowest concentration of SARS-CoV-2 viral copies this assay can detect is 138 copies/mL. A negative result does not preclude SARS-Cov-2 infection and should not be used as the sole basis for treatment or other patient management decisions. A negative result may occur with  improper specimen collection/handling, submission of specimen other than nasopharyngeal swab, presence of viral mutation(s) within the areas targeted by this assay, and inadequate number of viral copies(<138 copies/mL). A negative result must be combined with clinical observations, patient history, and epidemiological information. The expected result is Negative.  Fact Sheet for Patients:  BloggerCourse.com  Fact Sheet for Healthcare Providers:  SeriousBroker.it  This test is no t yet approved or cleared by the [place] FDA and  has been authorized for detection and/or diagnosis of SARS-CoV-2 by FDA under an Emergency Use Authorization (EUA). This EUA will remain  in effect (meaning this test can be used) for the duration of the COVID-19 declaration under Section 564(b)(1) of the Act, 21 U.S.C.section 360bbb-3(b)(1), unless the authorization is terminated  or revoked sooner.       Influenza A by PCR NEGATIVE NEGATIVE Final   Influenza B by PCR NEGATIVE NEGATIVE Final    Comment: (NOTE) The Xpert Xpress SARS-CoV-2/FLU/RSV plus assay is intended as an aid in the diagnosis of influenza from Nasopharyngeal swab specimens and should not be used as a sole basis for treatment. Nasal washings and aspirates are unacceptable for Xpert Xpress SARS-CoV-2/FLU/RSV testing.  Fact Sheet for  Patients: BloggerCourse.com  Fact Sheet for Healthcare Providers: SeriousBroker.it  This test is not yet approved or cleared by the [place] FDA and has been authorized for detection and/or diagnosis of SARS-CoV-2 by FDA under an Emergency Use Authorization (EUA). This EUA will remain in effect (meaning this test can be used) for the duration of the COVID-19 declaration under Section 564(b)(1) of the Act, 21 U.S.C. section 360bbb-3(b)(1), unless the authorization is terminated or revoked.  Performed at [facility], [address] [place], [place] [address]   Resp Panel by RT-PCR (Flu A&B, Covid) Nasopharyngeal Swab     Status: None   Collection Time: 08/14/20 11:58 AM   Specimen: Nasopharyngeal Swab; Nasopharyngeal(NP) swabs in vial transport medium  Result Value Ref Range Status   SARS Coronavirus 2 by RT PCR NEGATIVE NEGATIVE Final    Comment: (NOTE) SARS-CoV-2 target nucleic acids are NOT DETECTED.  The SARS-CoV-2 RNA is generally detectable in upper respiratory specimens during the acute phase of infection. The lowest concentration of SARS-CoV-2 viral copies this assay can detect is 138 copies/mL. A negative result does not preclude SARS-Cov-2 infection and should not be used as the sole basis for treatment or other patient management decisions. A negative result may occur with  improper specimen collection/handling, submission of specimen other than nasopharyngeal swab, presence of viral mutation(s) within the areas targeted by this assay, and inadequate number of viral copies(<138 copies/mL). A negative result must be combined with clinical observations, patient history, and epidemiological information. The expected result is Negative.  Fact Sheet for Patients:  BloggerCourse.com  Fact Sheet for Healthcare Providers:  SeriousBroker.it  This test is no t  yet approved or cleared by the [place] and  has been authorized for detection and/or diagnosis of SARS-CoV-2 by FDA under an Emergency Use Authorization (EUA). This EUA will remain  in effect (meaning this test can be used) for the duration of the COVID-19 declaration under Section 564(b)(1) of the Act, 21 U.S.C.section 360bbb-3(b)(1), unless the authorization is terminated  or revoked sooner.       Influenza A by PCR NEGATIVE NEGATIVE Final   Influenza B by PCR NEGATIVE NEGATIVE Final    Comment: (NOTE) The Xpert Xpress SARS-CoV-2/FLU/RSV plus assay is intended as an aid in the diagnosis of influenza from Nasopharyngeal swab specimens and should not be used as a sole basis for treatment. Nasal washings and aspirates are unacceptable for Xpert Xpress SARS-CoV-2/FLU/RSV testing.  Fact Sheet for Patients: BloggerCourse.com  Fact Sheet for Healthcare Providers: SeriousBroker.it  This test is not yet approved or cleared by the [place] FDA and has been authorized for detection and/or diagnosis of SARS-CoV-2 by FDA under an Emergency Use Authorization (EUA). This EUA will remain in effect (meaning this test can be used) for the duration of the COVID-19 declaration under Section 564(b)(1) of the Act, 21 U.S.C. section 360bbb-3(b)(1), unless the authorization is terminated or revoked.  Performed at [facility], [address] [place], [place] [address]      Labs: BNP (last 3 results) Recent Labs    07/02/20 1218  BNP 79.7  Urinalysis    Component Value Date/Time   COLORURINE YELLOW (A) 08/06/2020 1446   APPEARANCEUR CLEAR (A) 08/06/2020 1446   APPEARANCEUR Clear 01/13/2013 1657   LABSPEC 1.017 08/06/2020 1446   LABSPEC 1.006 01/13/2013 1657   PHURINE 5.0 08/06/2020 1446   GLUCOSEU NEGATIVE 08/06/2020 1446   GLUCOSEU Negative 01/13/2013 1657   HGBUR NEGATIVE 08/06/2020 1446   BILIRUBINUR  NEGATIVE 08/06/2020 1446   BILIRUBINUR Negative 01/13/2013 1657   KETONESUR 80 (A) 08/06/2020 1446   PROTEINUR NEGATIVE 08/06/2020 1446   NITRITE NEGATIVE 08/06/2020 1446   LEUKOCYTESUR NEGATIVE 08/06/2020 1446   LEUKOCYTESUR Negative 01/13/2013 1657   Sepsis Labs Invalid input(s): PROCALCITONIN,  WBC,  LACTICIDVEN Microbiology Recent Results (from the past 240 hour(s))  Resp Panel by RT-PCR (Flu A&B, Covid) Nasopharyngeal Swab     Status: None   Collection Time: 08/06/20  3:38 PM   Specimen: Nasopharyngeal Swab; Nasopharyngeal(NP) swabs in vial transport medium  Result Value Ref Range Status   SARS Coronavirus 2 by RT PCR NEGATIVE NEGATIVE Final    Comment: (NOTE) SARS-CoV-2 target nucleic acids are NOT DETECTED.  The SARS-CoV-2 RNA is generally detectable in upper respiratory specimens during the acute phase of infection. The lowest concentration of SARS-CoV-2 viral copies this assay can detect is 138 copies/mL. A negative result does not preclude SARS-Cov-2 infection and should not be used as the sole basis for treatment or other patient management decisions. A negative result may occur with  improper specimen collection/handling, submission of specimen other than nasopharyngeal swab, presence of viral mutation(s) within the areas targeted by this assay, and inadequate number of viral copies(<138 copies/mL). A negative result must be combined with clinical observations, patient history, and epidemiological information. The expected result is Negative.  Fact Sheet for Patients:  BloggerCourse.com  Fact Sheet for Healthcare Providers:  SeriousBroker.it  This test is no t yet approved or cleared by the [place] FDA and  has been authorized for detection and/or diagnosis of SARS-CoV-2 by FDA under an  Emergency Use Authorization (EUA). This EUA will remain  in effect (meaning this test can be used) for the duration of  the COVID-19 declaration under Section 564(b)(1) of the Act, 21 U.S.C.section 360bbb-3(b)(1), unless the authorization is terminated  or revoked sooner.       Influenza A by PCR NEGATIVE NEGATIVE Final   Influenza B by PCR NEGATIVE NEGATIVE Final    Comment: (NOTE) The Xpert Xpress SARS-CoV-2/FLU/RSV plus assay is intended as an aid in the diagnosis of influenza from Nasopharyngeal swab specimens and should not be used as a sole basis for treatment. Nasal washings and aspirates are unacceptable for Xpert Xpress SARS-CoV-2/FLU/RSV testing.  Fact Sheet for Patients: BloggerCourse.com  Fact Sheet for Healthcare Providers: SeriousBroker.it  This test is not yet approved or cleared by the [place] FDA and has been authorized for detection and/or diagnosis of SARS-CoV-2 by FDA under an Emergency Use Authorization (EUA). This EUA will remain in effect (meaning this test can be used) for the duration of the COVID-19 declaration under Section 564(b)(1) of the Act, 21 U.S.C. section 360bbb-3(b)(1), unless the authorization is terminated or revoked.  Performed at [facility], [address] [place], [place] [address]   Resp Panel by RT-PCR (Flu A&B, Covid) Nasopharyngeal Swab     Status: None   Collection Time: 08/14/20 11:58 AM   Specimen: Nasopharyngeal Swab; Nasopharyngeal(NP) swabs in vial transport medium  Result Value Ref Range Status   SARS Coronavirus 2 by RT PCR NEGATIVE NEGATIVE Final    Comment: (NOTE) SARS-CoV-2 target nucleic acids are NOT DETECTED.  The SARS-CoV-2 RNA is generally detectable in upper respiratory specimens during the acute phase of infection. The lowest concentration of SARS-CoV-2 viral copies this assay can detect is 138 copies/mL. A negative result does not preclude SARS-Cov-2 infection and should not be used as the sole basis for treatment or other patient management decisions. A  negative result may occur with  improper specimen collection/handling, submission of specimen other than nasopharyngeal swab, presence of viral mutation(s) within the areas targeted by this assay, and inadequate number of viral copies(<138 copies/mL). A negative result must be combined with clinical observations, patient history, and epidemiological information. The expected result is Negative.  Fact Sheet for Patients:  BloggerCourse.com  Fact Sheet for Healthcare Providers:  SeriousBroker.it  This test is no t yet approved or cleared by the [place] FDA and  has been authorized for detection and/or diagnosis of SARS-CoV-2 by FDA under an Emergency Use Authorization (EUA). This EUA will remain  in effect (meaning this test can be used) for the duration of the COVID-19 declaration under Section 564(b)(1) of the Act, 21 U.S.C.section 360bbb-3(b)(1), unless the authorization is terminated  or revoked sooner.       Influenza A by PCR NEGATIVE NEGATIVE Final   Influenza B by PCR NEGATIVE NEGATIVE Final    Comment: (NOTE) The Xpert Xpress SARS-CoV-2/FLU/RSV plus assay is intended as an aid in the diagnosis of influenza from Nasopharyngeal swab specimens and should not be used as a sole basis for treatment. Nasal washings and aspirates are unacceptable for Xpert Xpress SARS-CoV-2/FLU/RSV testing.  Fact Sheet for Patients: BloggerCourse.com  Fact Sheet for Healthcare Providers: [email or web address]  This test is not yet approved or cleared by the [place] FDA and has been authorized for detection and/or diagnosis of SARS-CoV-2 by FDA under an Emergency Use Authorization (EUA). This EUA will remain in effect (meaning this test can be used) for the duration of the COVID-19 declaration  under Section 564(b)(1) of the Act, 21 U.S.C. section 360bbb-3(b)(1), unless the authorization  is terminated or revoked.  Performed at [facility], [address] [place], [place] [address]      Time coordinating discharge: Over 30 minutes  SIGNED:   Baldwin JamaicaHannah Joye Wesenberg, MD  [facility] 08/14/2020, 2:05 PM Pager

## 2022-09-29 IMAGING — CR DG CHEST 1V PORT
1 series · 1 of 1 positions shown · non-contrast
Comparison: 03/19/2016

CLINICAL DATA: Fell.  Right-sided chest pain.

EXAM:
PORTABLE CHEST 1 VIEW

[chest ap]
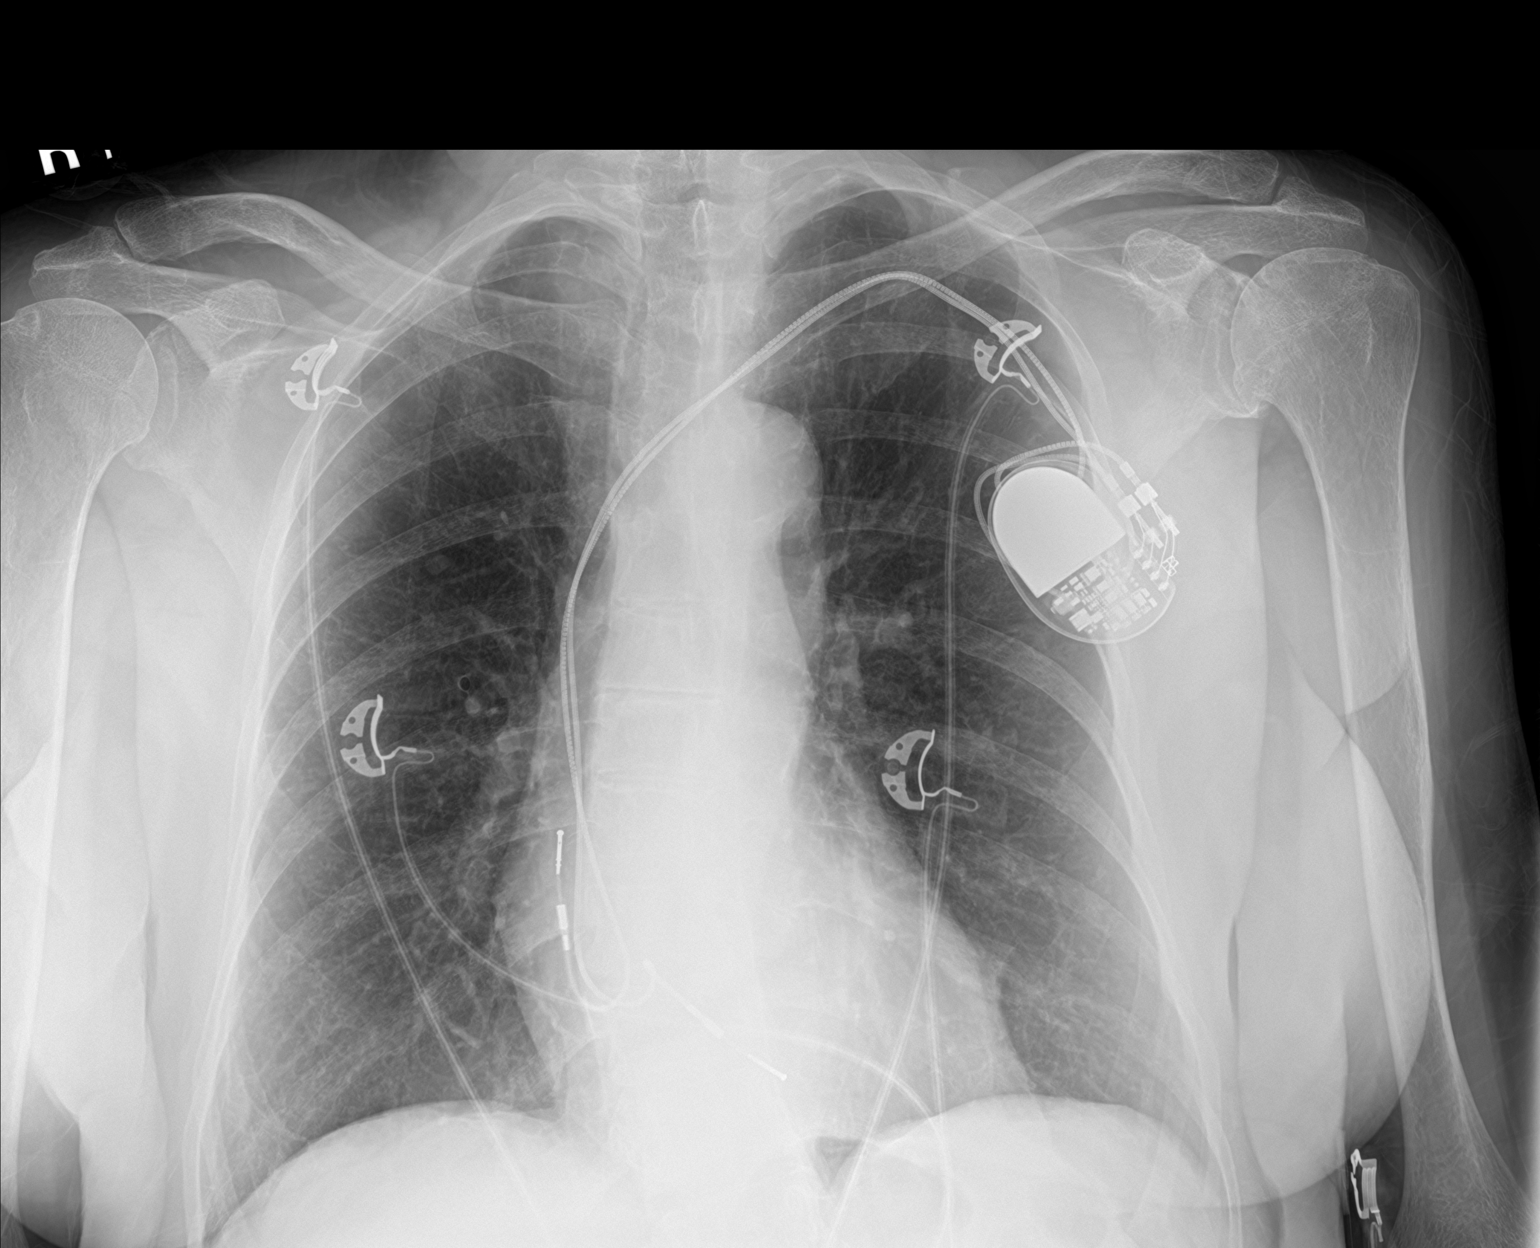

[1 of 1 positions shown; findings below may reference images not displayed]

FINDINGS: The cardiac silhouette, mediastinal and hilar contours are within
normal limits and stable. The lungs are clear. No pneumothorax or
pleural effusion.

Right upper lobe lung nodule is suspected. I do not see this on the
prior chest x-ray. Recommend chest CT for further evaluation.

No acute bony findings.
IMPRESSION: 1. No acute cardiopulmonary findings.
2. Suspect right upper lobe lung nodule. Recommend chest CT for
further evaluation.

## 2023-09-19 ENCOUNTER — Emergency Department
Admission: EM | Admit: 2023-09-19 | Discharge: 2023-09-20 | Disposition: A | Discharge status: Home / Self Care | Payer: Medicare Other | Attending: Emergency Medicine | Admitting: Emergency Medicine

## 2023-09-19 DIAGNOSIS — Z8673 Personal history of transient ischemic attack (TIA), and cerebral infarction without residual deficits: Secondary | ICD-10-CM | POA: Insufficient documentation

## 2023-09-19 DIAGNOSIS — R45851 Suicidal ideations: Secondary | ICD-10-CM

## 2023-09-19 DIAGNOSIS — Z79899 Other long term (current) drug therapy: Secondary | ICD-10-CM | POA: Diagnosis not present

## 2023-09-19 DIAGNOSIS — Z950 Presence of cardiac pacemaker: Secondary | ICD-10-CM | POA: Diagnosis not present

## 2023-09-19 DIAGNOSIS — R4589 Other symptoms and signs involving emotional state: Secondary | ICD-10-CM

## 2023-09-19 DIAGNOSIS — I639 Cerebral infarction, unspecified: Secondary | ICD-10-CM | POA: Diagnosis present

## 2023-09-19 DIAGNOSIS — F32A Depression, unspecified: Secondary | ICD-10-CM | POA: Diagnosis present

## 2023-09-19 DIAGNOSIS — W19XXXA Unspecified fall, initial encounter: Secondary | ICD-10-CM

## 2023-09-19 DIAGNOSIS — Y92009 Unspecified place in unspecified non-institutional (private) residence as the place of occurrence of the external cause: Secondary | ICD-10-CM

## 2023-09-19 LAB — COMPREHENSIVE METABOLIC PANEL
ALT: 12 U/L (ref 0–44)
AST: 18 U/L (ref 15–41)
Albumin: 4.1 g/dL (ref 3.5–5.0)
Alkaline Phosphatase: 101 U/L (ref 38–126)
Anion gap: 13 (ref 5–15)
BUN: 24 mg/dL — ABNORMAL HIGH (ref 8–23)
CO2: 27 mmol/L (ref 22–32)
Calcium: 9.7 mg/dL (ref 8.9–10.3)
Chloride: 97 mmol/L — ABNORMAL LOW (ref 98–111)
Creatinine, Ser: 0.85 mg/dL (ref 0.44–1.00)
GFR, Estimated: >60 mL/min (ref >60)
Glucose, Bld: 107 mg/dL — ABNORMAL HIGH (ref 70–99)
Potassium: 3.5 mmol/L (ref 3.5–5.1)
Sodium: 137 mmol/L (ref 135–145)
Total Bilirubin: 0.4 mg/dL (ref 0.0–1.2)
Total Protein: 8 g/dL (ref 6.5–8.1)

## 2023-09-19 LAB — CBC
HCT: 42.2 % (ref 36.0–46.0)
Hemoglobin: 13.7 g/dL (ref 12.0–15.0)
MCH: 29.5 pg (ref 26.0–34.0)
MCHC: 32.5 g/dL (ref 30.0–36.0)
MCV: 90.8 fL (ref 80.0–100.0)
Platelets: 290 10*3/uL (ref 150–400)
RBC: 4.65 MIL/uL (ref 3.87–5.11)
RDW: 13.7 % (ref 11.5–15.5)
WBC: 11.7 10*3/uL — ABNORMAL HIGH (ref 4.0–10.5)
nRBC: 0 % (ref 0.0–0.2)

## 2023-09-19 LAB — ETHANOL: Alcohol, Ethyl (B): <10 mg/dL (ref <10)

## 2023-09-19 LAB — SALICYLATE LEVEL: Salicylate Lvl: <7 mg/dL — ABNORMAL LOW (ref 7.0–30.0)

## 2023-09-19 LAB — ACETAMINOPHEN LEVEL: Acetaminophen (Tylenol), Serum: <10 ug/mL — ABNORMAL LOW (ref 10–30)

## 2023-09-19 NOTE — BH Assessment (Signed)
 Comprehensive Clinical Assessment (CCA) Screening, Triage and Referral Note  09/19/2023 Donna Dodson [id number]   Chief Complaint: Patient presents to the ED, via EMS, from her memory care unit.  Patient reports making suicidal statements because she does not like where she is saying.  She says that staff are lying on and about her, although she does not have proof.  She states she concluded that the staff were saying those things but she didn't actually hear them.  Patient was not able to elaborate on what things staff were saying.  She agreed that it could all be just her imagination.  She denies SI and says she only said she was suicidal to get out of the facility.  Patient denies any previous attempts.  She reports no homicidal ideations towards staff at the facility.  She reports no audio/visual hallucinations and denies any drug/alcohol use.  Visit Diagnosis: Suicidal ideations w/out intent  Patient Reported Information How did you hear about us ? Self  What Is the Reason for Your Visit/Call Today? Suicidal ideation without intent  How Long Has This Been Causing You Problems? 1 wk - 1 month  What Do You Feel Would Help You the Most Today? Housing Assistance   Have You Recently Had Any Thoughts About Hurting Yourself? Yes  Are You Planning to Commit Suicide/Harm Yourself At This time? No   Have you Recently Had Thoughts About Hurting Someone Sherral? No  Are You Planning to Harm Someone at This Time? No  Explanation: No data recorded  Have You Used Any Alcohol or Drugs in the Past 24 Hours? No data recorded How Long Ago Did You Use Drugs or Alcohol? No data recorded What Did You Use and How Much? No data recorded  Do You Currently Have a Therapist/Psychiatrist? No data recorded Name of Therapist/Psychiatrist: None reported   Have You Been Recently Discharged From Any Office Practice or Programs? No  Explanation of Discharge From Practice/Program: No data  recorded   CCA Screening Triage Referral Assessment Type of Contact: Face-to-Face  Telemedicine Service Delivery:   Is this Initial or Reassessment?   Date Telepsych consult ordered in [facility]:    Time Telepsych consult ordered in [facility]:    Location of Assessment: [facility] ED  Provider Location: [facility] ED    Collateral Involvement: None   Does Patient Have a Court Appointed Legal Guardian? No data recorded Name and Contact of Legal Guardian: No data recorded If Minor and Not Living with Parent(s), Who has Custody? No data recorded Is CPS involved or ever been involved? Never  Is APS involved or ever been involved? Never   Patient Determined To Be At Risk for Harm To Self or Others Based on Review of Patient Reported Information or Presenting Complaint? No  Method: No Plan  Availability of Means: No access or NA  Intent: Vague intent or NA  Notification Required: No need or identified person  Additional Information for Danger to Others Potential: No data recorded Additional Comments for Danger to Others Potential: No data recorded Are There Guns or Other Weapons in Your Home? No  Types of Guns/Weapons: No data recorded Are These Weapons Safely Secured?                            No data recorded Who Could Verify You Are Able To Have These Secured: No data recorded Do You Have any Outstanding Charges, Pending Court Dates, Parole/Probation? No data recorded Contacted To  Inform of Risk of Harm To Self or Others: No data recorded  Does Patient Present under Involuntary Commitment? No    County of Residence: [place]   Patient Currently Receiving the Following Services: Skilled Nursing Facility   Determination of Need: Emergent (2 hours)   Options For Referral: Other: Comment (Refer back to memory care unit)   Disposition Recommendation per psychiatric provider: Plan Post Discharge/Psychiatric Care Follow-up resources    Donna Dodson, Counselor

## 2023-09-19 NOTE — ED Triage Notes (Signed)
 Pt comes via [facility] from [place]  and states that she is having SI because she does not like the facility. No plan, denies HI, AVH.

## 2023-09-19 NOTE — ED Notes (Signed)
 Patient states she is unsure why she is in the ED.  Patient denies any complaint of pain or urinary s/s.  Patient does answer yes when asked if she wants to harm herself.  When questioned why she wants to hurt herself replies, I'm just not happy with the way things are and the way they talk tome.  Unable to get patient to elaborated.  Patient oriented, calm and cooperative at this time.

## 2023-09-19 NOTE — ED Provider Notes (Signed)
 [facility] Provider Note    Event Date/Time   First MD Initiated Contact with Patient 09/19/23 2135     (approximate)   History   Suicidal   HPI  Donna Dodson is a 83 y.o. female comes to the emergency department today because of concerns for suicidal ideation.  The patient states that she has had issues with suicidal ideation in the past.  Has seen a psychiatrist in the past but states she has not been seeing the psychiatrist as recently.  The patient does state that she is unhappy with where she is living.  She denies any recent illness.     Physical Exam   Triage Vital Signs: ED Triage Vitals  Encounter Vitals Group     BP 09/19/23 2104 135/79     Systolic BP Percentile --      Diastolic BP Percentile --      Pulse Rate 09/19/23 2104 72     Resp 09/19/23 2104 16     Temp 09/19/23 2104 98.3 F (36.8 C)     Temp Source 09/19/23 2104 Oral     SpO2 09/19/23 2104 98 %     Weight --      Height --      Head Circumference --      Peak Flow --      Pain Score 09/19/23 2102 0     Pain Loc --      Pain Education --      Exclude from Growth Chart --     Most recent vital signs: Vitals:   09/19/23 2104 09/19/23 2104  BP: 135/79 135/79  Pulse: 72 72  Resp: 16 16  Temp: 98.3 F (36.8 C) 98.3 F (36.8 C)  SpO2: 98% 97%   General: Awake, alert, oriented. CV:  Good peripheral perfusion.  Resp:  Normal effort.  Abd:  No distention.    ED Results / Procedures / Treatments   Labs (all labs ordered are listed, but only abnormal results are displayed) Labs Reviewed  COMPREHENSIVE METABOLIC PANEL - Abnormal; Notable for the following components:      Result Value   Chloride 97 (*)    Glucose, Bld 107 (*)    BUN 24 (*)    All other components within normal limits  CBC - Abnormal; Notable for the following components:   WBC 11.7 (*)    All other components within normal limits  ETHANOL  SALICYLATE LEVEL  ACETAMINOPHEN  LEVEL  URINE  DRUG SCREEN, QUALITATIVE ([facility] ONLY)     EKG  None   RADIOLOGY None   PROCEDURES:  Critical Care performed: No    MEDICATIONS ORDERED IN ED: Medications - No data to display   IMPRESSION / MDM / ASSESSMENT AND PLAN / ED COURSE  I reviewed the triage vital signs and the nursing notes.                              Differential diagnosis includes, but is not limited to, psychiatric illness, drug induced mood disorder  Patient's presentation is most consistent with acute presentation with potential threat to life or bodily function.  Patient presents to the emergency department today because of concerns for depression and thoughts of self-harm.  Patient denies any medical complaints.  Will have psychiatry evaluate.  The patient has been placed in psychiatric observation due to the need to provide a safe environment for the patient  while obtaining psychiatric consultation and evaluation, as well as ongoing medical and medication management to treat the patient's condition.  The patient has not been placed under full IVC at this time.       FINAL CLINICAL IMPRESSION(S) / ED DIAGNOSES   Final diagnoses:  Thoughts of self harm     Note:  This document was prepared using Dragon voice recognition software and may include unintentional dictation errors.    Floy Roberts, MD 09/19/23 2201

## 2023-09-19 NOTE — ED Notes (Signed)
ED provider speaking with patient
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
## 2023-09-19 NOTE — ED Notes (Signed)
 Belongings placed in belongings bag. Pt dressed out by this RN Rosalie Doctor RN White t shirt Yellow bracelet

## 2023-09-19 NOTE — ED Notes (Signed)
 Patient to interview room via wheelchair.

## 2023-09-19 NOTE — ED Notes (Signed)
 Pt received a warm blanket by this tech. No other request at this time.

## 2023-09-20 ENCOUNTER — Other Ambulatory Visit: Payer: Self-pay

## 2023-09-20 DIAGNOSIS — R45851 Suicidal ideations: Secondary | ICD-10-CM | POA: Diagnosis not present

## 2023-09-20 LAB — URINE DRUG SCREEN, QUALITATIVE (ARMC ONLY)
Amphetamines, Ur Screen: NOT DETECTED
Barbiturates, Ur Screen: NOT DETECTED
Benzodiazepine, Ur Scrn: NOT DETECTED
Cannabinoid 50 Ng, Ur ~~LOC~~: NOT DETECTED
Cocaine Metabolite,Ur ~~LOC~~: NOT DETECTED
MDMA (Ecstasy)Ur Screen: NOT DETECTED
Methadone Scn, Ur: NOT DETECTED
Opiate, Ur Screen: NOT DETECTED
Phencyclidine (PCP) Ur S: NOT DETECTED
Tricyclic, Ur Screen: NOT DETECTED

## 2023-09-20 NOTE — ED Provider Notes (Signed)
 Emergency Medicine Observation Re-evaluation Note  Donna Dodson is a 83 y.o. female, seen on rounds today.  Pt initially presented to the ED for complaints of Suicidal  Currently, the patient is is no acute distress. Denies any concerns at this time.  Physical Exam  Blood pressure 135/79, pulse 72, temperature 98.3 F (36.8 C), resp. rate 16, SpO2 97%.  Physical Exam: General: No apparent distress Pulm: Normal WOB Neuro: Moving all extremities Psych: Resting comfortably     ED Course / MDM     I have reviewed the labs performed to date as well as medications administered while in observation.  Recent changes in the last 24 hours include: No acute events overnight.  Plan   Current plan: Patient awaiting psychiatric disposition. Patient is not under full IVC at this time.    Roniyah Llorens, Josette SAILOR, DO 09/20/23 [phone]

## 2023-09-20 NOTE — ED Notes (Signed)
 Hospital meal provided, pt tolerated w/o complaints.  Waste discarded appropriately.

## 2023-09-20 NOTE — ED Notes (Signed)
 Spoke to TTS r/t pt reassess in the a.m. TTS and psych will reassess prior to dc back to memory care /  SNF housing

## 2023-09-20 NOTE — Consult Note (Signed)
 [facility] Follow-[facility]  Patient Name: .Donna Dodson  MRN: [id number]  DOB: [birth year]  Consult Order details:  Orders (From admission, onward)     Start     Ordered   09/19/23 2158  CONSULT TO CALL ACT TEAM       Ordering Provider: Floy Roberts, MD  Provider:  (Not yet assigned)  Question:  Reason for Consult?  Answer:  depression   09/19/23 2157   09/19/23 2158  IP CONSULT TO PSYCHIATRY       Ordering Provider: Floy Roberts, MD  Provider:  (Not yet assigned)  Question Answer Comment  Place call to: psychiatry   Reason for Consult Consult   Diagnosis/Clinical Info for Consult: depression      09/19/23 2157             Mode of Visit: Tele-visit Virtual Statement:TELE PSYCHIATRY ATTESTATION & CONSENT As the provider for this telehealth consult, I attest that I verified the patient's identity using two separate identifiers, introduced myself to the patient, provided my credentials, disclosed my location, and performed this encounter via a HIPAA-compliant, real-time, face-to-face, two-way, interactive audio and video platform and with the full consent and agreement of the patient (or guardian as applicable.) Patient physical location: [facility] Department. Telehealth provider physical location: home office in state of [place] .   Video start time: 13:04 Video end time: 13:24    Psychiatry Consult Evaluation  Service Date: September 20, 2023 LOS:  LOS: 0 days  Chief Complaint I think I'm doing great  Primary Psychiatric Diagnoses  Suicidal ideation 2.  Depression  Assessment  Donna Dodson is a 83 y.o. female admitted: Presented to the EDfor 09/19/2023  9:27 PM for brought in by EMS from [facility] for suicidal ideation. She carries the psychiatric diagnoses of depression, dementia and anxiety and has a past medical history of  stroke, atrial fibrillation, rhabdomyolysis, cardiac pacemaker and hyperlipidemia.   Patient  denies wanting to harm herself; she does not like where she is living. She does not have access to weapons or medications.  She walks with a walker and requires assistance with ADLs. Current outpatient psychotropic medications include depakote sprinkles and trazodone and historically she has had a positive response to these medications. She was compliant with medications prior to admission as evidenced by medications are administered by her facility. On initial examination, patient is pleasant and cooperative.  She denies a desire to hurt herself, she simply doesn't want to live in the facility she has been living in and she say she misses her son. Please see plan below for detailed recommendations.   Diagnoses:  Active Hospital problems: Principal Problem:   Suicidal ideation Active Problems:   History of stroke   Fall at home, initial encounter   Depression    Plan   ## Psychiatric Medication Recommendations:  Resume home medications  ## Medical Decision Making Capacity: Not specifically addressed in this encounter  ## Further Work-up:  -- Pertinent labwork reviewed earlier this admission includes: UDS, salicylate, alcohol, acetaminophen , CBC and CMP   ## Disposition:-- There are no psychiatric contraindications to discharge at this time  ## Behavioral / Environmental: -Utilize compassion and acknowledge the patient's experiences while setting clear and realistic expectations for care.    ## Safety and Observation Level:  - Based on my clinical evaluation, I estimate the patient to be at low risk of self harm in the current setting. - At this time, we recommend  routine.  This decision is based on my review of the chart including patient's history and current presentation, interview of the patient, mental status examination, and consideration of suicide risk including evaluating suicidal ideation, plan, intent, suicidal or self-harm behaviors, risk factors, and protective factors. This  judgment is based on our ability to directly address suicide risk, implement suicide prevention strategies, and develop a safety plan while the patient is in the clinical setting. Please contact our team if there is a concern that risk level has changed.  CSSR Risk Category:C-SSRS RISK CATEGORY: High Risk  Suicide Risk Assessment: Patient has following modifiable risk factors for suicide: social isolation, which we are addressing by providing resources. Patient has following non-modifiable or demographic risk factors for suicide: None Patient has the following protective factors against suicide: Supportive family, She doesn't want to hurt her son, Lives in a facility where she is monitored 24/7 and does not have access to means.   Thank you for this consult request. Recommendations have been communicated to the primary team.  We will sign off at this time.   Donna Dodson Patient, NP       History of Present Illness  Relevant Aspects of Hospital ED Course:  Admitted on 09/19/2023 for brought in by EMS from [place] [facility] for suicidal ideation. She carries the psychiatric diagnoses of depression, dementia and anxiety and has a past medical history of  stroke, atrial fibrillation, rhabdomyolysis, cardiac pacemaker and hyperlipidemia.   Patient Report:  Donna Dodson, 83 y.o., female patient seen face to face by this provider, consulted with Dr. Sable; and chart reviewed on 09/20/23.  On evaluation Donna Dodson is bright and pleasant.  She says Hi I'm Donna Dodson! upon introduction.  Patient says it is May 2025 when asked orientation questions and then jokes well it will be soon enough and laughs when told it is January.  She talks about her son and says that he is wonderful.  She says she wishes she could see him more, but says they talk on the phone regularly.  She says she doesn't want to kill herself, she simply does not want to live in the facility she currently lives in.    During evaluation Donna Dodson is seated in a chair in no acute distress.  She is alert, oriented x 3; she knows who she is, where she is and that it is 2025, but she does think it's May.  She is calm, cooperative and attentive.  Her mood is bright with congruent affect. She has normal speech, and behavior.  Objectively there is no evidence of psychosis/mania or delusional thinking.  Patient is able to converse coherently, goal directed thoughts, no distractibility, or pre-occupation.  She also denies suicidal/self-harm/homicidal ideation, psychosis, and paranoia.  Patient answered questions appropriately.    Psych ROS:  Depression: endorses Anxiety:  endorses   Review of Systems  Neurological:  Positive for weakness.  Psychiatric/Behavioral:  Positive for depression.   All other systems reviewed and are negative.    Psychiatric and Social History  Psychiatric History:  Information collected from patient and chart review  Prev Dx/Sx: depression, dementia and anxiety Current Psych Provider: unknown Home Meds (current): depakote sprinkles and trazodone Previous Med Trials: unknown Therapy: unknown  Prior Psych Hospitalization: unknown  Prior Self Harm: endorses previous thoughts of suicidal ideation Prior Violence: denies  Family Psych History: unknown Family Hx suicide: denies  Social History:  Living Situation: Lives at [facility] Spiritual Hx: unknown Access to weapons/lethal  means: denies   Substance History Alcohol: denies  Tobacco: denies Illicit drugs: denies Prescription drug abuse: denies Rehab hx: denies  Exam Findings  Physical Exam:  Vital Signs:  Temp:  [98 F (36.7 C)-98.3 F (36.8 C)] 98 F (36.7 C) (01/12 0750) Pulse Rate:  [68-72] 68 (01/12 0750) Resp:  [16] 16 (01/12 0750) BP: (126-135)/(79-81) 126/81 (01/12 0750) SpO2:  [93 %-98 %] 93 % (01/12 0750) Blood pressure 126/81, pulse 68, temperature 98 F (36.7 C), temperature source Oral, resp. rate 16, SpO2 93%. There  is no height or weight on file to calculate BMI.  Physical Exam Vitals and nursing note reviewed.  Eyes:     Pupils: Pupils are equal, round, and reactive to light.  Pulmonary:     Effort: Pulmonary effort is normal.  Neurological:     Mental Status: She is alert. She is disoriented.  Psychiatric:        Attention and Perception: Attention and perception normal.        Mood and Affect: Mood normal.        Speech: Speech normal.        Behavior: Behavior normal. Behavior is cooperative.        Thought Content: Thought content normal.        Cognition and Memory: Cognition normal. She exhibits impaired recent memory and impaired remote memory.     Mental Status Exam: General Appearance: Casual  Orientation:  Other:  Oriented to self, place and year  Memory:  Immediate;   Fair Recent;   Poor Remote;   Fair  Concentration:  Concentration: Good  Recall:  Fair  Attention  Good  Eye Contact:  Good  Speech:  Clear and Coherent  Language:  Good  Volume:  Normal  Mood: normal  Affect:  Congruent  Thought Process:  Coherent  Thought Content:  WDL  Suicidal Thoughts:  Yes.  without intent/plan  Homicidal Thoughts:  No  Judgement:  Fair  Insight:  Fair  Psychomotor Activity:  Decreased  Akathisia:  No  Fund of Knowledge:  Fair      Assets:  Engineer, Maintenance Social Support  Cognition:  Impaired,  Mild  ADL's:  Impaired  AIMS (if indicated):        Other History   These have been pulled in through the EMR, reviewed, and updated if appropriate.  Family History:  The patient's Family history is unknown by patient.  Medical History: Past Medical History:  Diagnosis Date  . Depressed   . Pacemaker   . Stroke [facility])     Surgical History: Past Surgical History:  Procedure Laterality Date  . PACEMAKER PLACEMENT       Medications:  No current facility-administered medications for this encounter.  Current Outpatient Medications:  .  albuterol  (VENTOLIN   HFA) 108 (90 Base) MCG/ACT inhaler, Inhale 2 puffs into the lungs every 6 (six) hours as needed., Disp: , Rfl:  .  aspirin  EC 81 MG EC tablet, Take 1 tablet (81 mg total) by mouth daily. Swallow whole., Disp: 30 tablet, Rfl: 11 .  atorvastatin  (LIPITOR) 10 MG tablet, Take 10 mg by mouth daily., Disp: , Rfl:  .  cyanocobalamin  1000 MCG tablet, Take 1,000 mcg by mouth daily., Disp: , Rfl:  .  divalproex (DEPAKOTE SPRINKLE) 125 MG capsule, Take 125 mg by mouth 2 (two) times daily., Disp: , Rfl:  .  ergocalciferol (VITAMIN D2) 1.25 MG (50000 UT) capsule, Take 50,000 Units by mouth once a week. wednesday,  Disp: , Rfl:  .  furosemide (LASIX) 40 MG tablet, Take 40 mg by mouth daily., Disp: , Rfl:  .  Multiple Vitamins-Minerals (MULTIVITAMIN WITH MINERALS) tablet, Take 1 tablet by mouth daily., Disp: , Rfl:  .  traZODone (DESYREL) 50 MG tablet, Take 50 mg by mouth at bedtime., Disp: , Rfl:  .  atorvastatin  (LIPITOR) 20 MG tablet, Take 1 tablet (20 mg total) by mouth daily., Disp: 30 tablet, Rfl: 11 .  nicotine  (NICODERM CQ  - DOSED IN MG/24 HOURS) 14 mg/24hr patch, Place 1 patch (14 mg total) onto the skin daily. (Patient not taking: Reported on 09/20/2023), Disp: 28 patch, Rfl: 0  Allergies: Allergies  Allergen Reactions  . Sulfa Antibiotics Rash    Jame Morrell A Avian Konigsberg, NP

## 2023-09-20 NOTE — ED Notes (Signed)
 Report given to Justice, Charity fundraiser @  from [facility], located at [facility]  [phone].

## 2023-09-20 NOTE — ED Notes (Signed)
 Ms Lakenya continues to present at baseline.  Calm, and compliant denies all SI/HI and stated never any A/V hallucinations.  Pt continues to be pleasant and easily direct able. Cont to [facility] q15

## 2023-09-20 NOTE — ED Notes (Signed)
 Pt provided with lunch tray. Pt sitting up eating.

## 2023-09-20 NOTE — ED Notes (Signed)
 Pt up to bathroom x2 assist, pt is non ambulatory can stand x2 assist to transfer

## 2023-09-20 NOTE — ED Notes (Signed)
 VOL/ Plan Post Discharge/Psychiatric Care Follow-up resources

## 2023-09-20 NOTE — ED Notes (Signed)
 Pt arrived from [facility], located at [facility] [address]  [phone].  Spoke to [employer] and requested that pt MAR be faxed over as we have no information sent with this patient.  Waiting on pt MAR at this time

## 2024-04-08 ENCOUNTER — Other Ambulatory Visit: Payer: Self-pay

## 2024-04-08 ENCOUNTER — Emergency Department
Admission: EM | Admit: 2024-04-08 | Discharge: 2024-04-08 | Disposition: A | Discharge status: Skilled Nursing Facility | Attending: Emergency Medicine | Admitting: Emergency Medicine

## 2024-04-08 ENCOUNTER — Emergency Department

## 2024-04-08 DIAGNOSIS — R600 Localized edema: Secondary | ICD-10-CM | POA: Diagnosis not present

## 2024-04-08 DIAGNOSIS — R220 Localized swelling, mass and lump, head: Secondary | ICD-10-CM | POA: Diagnosis present

## 2024-04-08 DIAGNOSIS — F0390 Unspecified dementia without behavioral disturbance: Secondary | ICD-10-CM | POA: Insufficient documentation

## 2024-04-08 DIAGNOSIS — L539 Erythematous condition, unspecified: Secondary | ICD-10-CM

## 2024-04-08 DIAGNOSIS — L03211 Cellulitis of face: Secondary | ICD-10-CM | POA: Insufficient documentation

## 2024-04-08 DIAGNOSIS — I4891 Unspecified atrial fibrillation: Secondary | ICD-10-CM | POA: Insufficient documentation

## 2024-04-08 DIAGNOSIS — Z950 Presence of cardiac pacemaker: Secondary | ICD-10-CM | POA: Diagnosis not present

## 2024-04-08 DIAGNOSIS — L0390 Cellulitis, unspecified: Secondary | ICD-10-CM

## 2024-04-08 LAB — COMPREHENSIVE METABOLIC PANEL WITH GFR
ALT: 13 U/L (ref 0–44)
AST: 19 U/L (ref 15–41)
Albumin: 3.5 g/dL (ref 3.5–5.0)
Alkaline Phosphatase: 92 U/L (ref 38–126)
Anion gap: 14 (ref 5–15)
BUN: 21 mg/dL (ref 8–23)
CO2: 25 mmol/L (ref 22–32)
Calcium: 9.2 mg/dL (ref 8.9–10.3)
Chloride: 102 mmol/L (ref 98–111)
Creatinine, Ser: 0.96 mg/dL (ref 0.44–1.00)
GFR, Estimated: 59 mL/min — ABNORMAL LOW (ref >60)
Glucose, Bld: 137 mg/dL — ABNORMAL HIGH (ref 70–99)
Potassium: 3.9 mmol/L (ref 3.5–5.1)
Sodium: 141 mmol/L (ref 135–145)
Total Bilirubin: 0.8 mg/dL (ref 0.0–1.2)
Total Protein: 7.2 g/dL (ref 6.5–8.1)

## 2024-04-08 LAB — CBC WITH DIFFERENTIAL/PLATELET
Abs Immature Granulocytes: 0.04 K/uL (ref 0.00–0.07)
Basophils Absolute: 0.1 K/uL (ref 0.0–0.1)
Basophils Relative: 1 %
Eosinophils Absolute: 0.2 K/uL (ref 0.0–0.5)
Eosinophils Relative: 2 %
HCT: 41.3 % (ref 36.0–46.0)
Hemoglobin: 13.1 g/dL (ref 12.0–15.0)
Immature Granulocytes: 0 %
Lymphocytes Relative: 15 %
Lymphs Abs: 1.6 K/uL (ref 0.7–4.0)
MCH: 27.6 pg (ref 26.0–34.0)
MCHC: 31.7 g/dL (ref 30.0–36.0)
MCV: 86.9 fL (ref 80.0–100.0)
Monocytes Absolute: 0.7 K/uL (ref 0.1–1.0)
Monocytes Relative: 7 %
Neutro Abs: 8 K/uL — ABNORMAL HIGH (ref 1.7–7.7)
Neutrophils Relative %: 75 %
Platelets: 275 K/uL (ref 150–400)
RBC: 4.75 MIL/uL (ref 3.87–5.11)
RDW: 14 % (ref 11.5–15.5)
WBC: 10.6 K/uL — ABNORMAL HIGH (ref 4.0–10.5)
nRBC: 0 % (ref 0.0–0.2)

## 2024-04-08 LAB — BRAIN NATRIURETIC PEPTIDE: B Natriuretic Peptide: 124.1 pg/mL — ABNORMAL HIGH (ref 0.0–100.0)

## 2024-04-08 MED ORDER — CEFDINIR 300 MG PO CAPS: 300.0000 mg | ORAL_CAPSULE | Freq: Two times a day (BID) | ORAL | 0 refills | Status: AC

## 2024-04-08 MED ORDER — SODIUM CHLORIDE 0.9 % IV SOLN
100.0000 mg | Freq: Once | INTRAVENOUS | Status: AC
  Administered 2024-04-08: 100 mg via INTRAVENOUS
  Filled 2024-04-08: qty 100

## 2024-04-08 MED ORDER — SODIUM CHLORIDE 0.9 % IV SOLN
1.0000 g | Freq: Once | INTRAVENOUS | Status: AC
  Administered 2024-04-08: 1 g via INTRAVENOUS
  Filled 2024-04-08: qty 10

## 2024-04-08 MED ORDER — IOHEXOL 300 MG/ML  SOLN
75.0000 mL | Freq: Once | INTRAMUSCULAR | Status: AC | PRN
  Administered 2024-04-08: 75 mL via INTRAVENOUS

## 2024-04-08 MED ORDER — DOXYCYCLINE HYCLATE 100 MG PO CAPS
100.0000 mg | ORAL_CAPSULE | Freq: Two times a day (BID) | ORAL | 0 refills | Status: AC
Start: 2024-04-08 — End: 2024-04-18

## 2024-04-08 NOTE — ED Triage Notes (Signed)
 Arrived by [facility] from [place] assisted living. Staff called for right sided facial swelling. Has chronic edema in legs that staff reports is getting worse and noticed redness to right lower leg.  Denies pain   EMS vitals: 145/58 b/p 106HR 98% RA  History alzheimer's and at baseline A&O x2

## 2024-04-08 NOTE — ED Notes (Signed)
 Rn to bedside to introduce self to pt. Pt is a HIGH falls risk and no fall bundle in place. This RN placed fall bundle items. Pt is alert but unable to answer some questions as asked. No acute distress. Pt states I live on [address]. Per papers at bedside she lives at [place].

## 2024-04-08 NOTE — ED Triage Notes (Signed)
 Pt to ED via [facility] from [facility] for c/o swelling to right side of face, right leg and left arm edema. Pt alert and oriented to self and place. NAD.

## 2024-04-08 NOTE — ED Provider Notes (Signed)
 [facility] Provider Note    Event Date/Time   First MD Initiated Contact with Patient 04/08/24 1356     (approximate)   History   Facial Swelling   HPI  Donna Dodson is a 82 y.o. female history of dementia, atrial fibrillation, pacemaker, chronic lower extremity edema, presenting with right facial swelling.  Patient states that her legs are always swollen, she has no chest pain or shortness of breath, no fever, no trouble swallowing, no lip swelling, no new weakness or numbness.  No falls.  No abdominal pain.  Per independent history from EMS, she is coming from assisted living, staff called for right facial swelling, she does have history of chronic edema in both legs, staff had noted that there was some redness to the right lower leg.      On independent chart review, she was admitted in 45 for weakness and inability to ambulate.  Was discharged to SNF.  Does have prior history of a mandibular abscess.  That was on the left side, had presented with left jaw pain and swelling.   Physical Exam   Triage Vital Signs: ED Triage Vitals  Encounter Vitals Group     BP 04/08/24 1126 (!) 168/78     Girls Systolic BP Percentile --      Girls Diastolic BP Percentile --      Boys Systolic BP Percentile --      Boys Diastolic BP Percentile --      Pulse Rate 04/08/24 1126 71     Resp 04/08/24 1126 20     Temp 04/08/24 1126 98.1 F (36.7 C)     Temp Source 04/08/24 1126 Oral     SpO2 04/08/24 1126 97 %     Weight --      Height 04/08/24 1134 5' 6 (1.676 m)     Head Circumference --      Peak Flow --      Pain Score 04/08/24 1132 0     Pain Loc --      Pain Education --      Exclude from Growth Chart --     Most recent vital signs: Vitals:   04/08/24 1126 04/08/24 1131  BP: (!) 168/78 (!) 140/81  Pulse: 71 87  Resp: 20 18  Temp: 98.1 F (36.7 C) 97.9 F (36.6 C)  SpO2: 97% 100%     General: Awake, no distress.  CV:  Good peripheral  perfusion.  Resp:  Normal effort.  No tachypnea or respiratory distress Abd:  No distention.  Soft nontender Other:  She does have very trace right lower facial swelling with mild overlying erythema, it is tender to palpation, she has no trismus, oropharynx is clear, no lip or tongue swelling, no submandibular tenderness or fullness.  Able to range her neck.  She does have edema to her lower extremities that appears symmetrical, her right lower extremity has mild erythema anteriorly that is mildly warm compared to the left.  Her bilateral upper extremity do not appear asymmetric.  She is moving all 4 extremities without focal weakness or numbness.   ED Results / Procedures / Treatments   Labs (all labs ordered are listed, but only abnormal results are displayed) Labs Reviewed  CBC WITH DIFFERENTIAL/PLATELET - Abnormal; Notable for the following components:      Result Value   WBC 10.6 (*)    Neutro Abs 8.0 (*)    All other components within normal limits  COMPREHENSIVE METABOLIC PANEL WITH GFR - Abnormal; Notable for the following components:   Glucose, Bld 137 (*)    GFR, Estimated 59 (*)    All other components within normal limits  BRAIN NATRIURETIC PEPTIDE - Abnormal; Notable for the following components:   B Natriuretic Peptide 124.1 (*)    All other components within normal limits    PROCEDURES:  Critical Care performed: No  Procedures   MEDICATIONS ORDERED IN ED: Medications  doxycycline (VIBRAMYCIN) 100 mg in sodium chloride  0.9 % 250 mL IVPB (has no administration in time range)  cefTRIAXone (ROCEPHIN) 1 g in sodium chloride  0.9 % 100 mL IVPB (has no administration in time range)     IMPRESSION / MDM / ASSESSMENT AND PLAN / ED COURSE  I reviewed the triage vital signs and the nursing notes.                              Differential diagnosis includes, but is not limited to, cellulitis, lymphedema, given her prior history of mandibular abscess and the right lower  facial tenderness, she has no tongue or lip swelling, doubt angioedema, he is able to tolerate his secretions, doubt posterior edema, no tenderness to her teeth, doubt periapical abscess at this time, no submandibular tenderness, fullness, her tongue is red not raised, considered but doubt Ludwig's angina.  Will get a CT max face, labs.  Will start her on antibiotics for cellulitis, IV doxycycline and ceftriaxone.  Patient's presentation is most consistent with acute presentation with potential threat to life or bodily function.  Independent interpretation of labs below.  Patient signed out pending CT imaging results.  If negative, likely will be discharged back to facility with antibiotics.    Clinical Course as of 04/08/24 1507  Fri Apr 08, 2024  1507 Independent review of labs, BNP is mildly elevated, mild leukocytosis, electrolytes are severely deranged, LFTs are normal. [TT]    Clinical Course User Index [TT] Waymond, Lorelle Cummins, MD     FINAL CLINICAL IMPRESSION(S) / ED DIAGNOSES   Final diagnoses:  Cellulitis, unspecified cellulitis site  Facial swelling  Redness     Rx / DC Orders   ED Discharge Orders     None        Note:  This document was prepared using Dragon voice recognition software and may include unintentional dictation errors.    Waymond Lorelle Cummins, MD 04/08/24 [phone]

## 2024-04-08 NOTE — ED Notes (Signed)
 This RN attempted IV. Unsuccessful. Pt has some lymphedema to left arm. IV consult placed.

## 2024-04-08 NOTE — Discharge Instructions (Addendum)
 Your CT scan today confirms a small area of skin infection over the jaw.  There is no abscess or other serious complication. Take antibiotics as prescribed to resolve this problem, and follow up with your primary care doctor.

## 2024-04-08 NOTE — ED Notes (Signed)
 RN attempted to call [facility] for report. No answer.

## 2024-04-08 NOTE — ED Notes (Signed)
 This RN attempted to call [facility] for report. No answer.

## 2024-04-08 NOTE — ED Triage Notes (Signed)
 See first nurse note.

## 2024-04-08 NOTE — ED Notes (Signed)
 Pt cleaned up. Pt was soiled in wet urine. During changing, pt started having some audible expiratory wheezing that wasn't present before. Urine smells strong of UTI. MD made aware.

## 2024-05-20 ENCOUNTER — Ambulatory Visit: Admitting: Podiatry

## 2024-09-08 DEATH — deceased
